# Patient Record
Sex: Male | Born: 1980
Health system: Southern US, Community
[De-identification: ages and names within clinical notes are randomized; demographics above are authoritative.]

## PROBLEM LIST (undated history)

## (undated) DIAGNOSIS — R531 Weakness: Secondary | ICD-10-CM

## (undated) DIAGNOSIS — Z9109 Other allergy status, other than to drugs and biological substances: Secondary | ICD-10-CM

## (undated) DIAGNOSIS — J45990 Exercise induced bronchospasm: Secondary | ICD-10-CM

## (undated) HISTORY — DX: Other allergy status, other than to drugs and biological substances: Z91.09

## (undated) HISTORY — DX: Weakness: R53.1

## (undated) HISTORY — DX: Exercise induced bronchospasm: J45.990

---

## 2001-09-07 DIAGNOSIS — R531 Weakness: Secondary | ICD-10-CM

## 2001-09-07 HISTORY — DX: Weakness: R53.1

## 2013-05-08 DIAGNOSIS — J45990 Exercise induced bronchospasm: Secondary | ICD-10-CM

## 2013-05-08 HISTORY — DX: Exercise induced bronchospasm: J45.990

## 2013-05-09 ENCOUNTER — Encounter: Payer: Self-pay | Admitting: Medical

## 2013-05-11 ENCOUNTER — Encounter: Payer: Self-pay | Admitting: Medical

## 2013-05-11 ENCOUNTER — Ambulatory Visit (INDEPENDENT_AMBULATORY_CARE_PROVIDER_SITE_OTHER): Payer: Managed Care, Other (non HMO) | Admitting: Medical

## 2013-05-11 VITALS — BP 110/70 | HR 84 | Temp 98.1°F | Resp 16 | Ht 69.2 in | Wt 254.0 lb

## 2013-05-11 DIAGNOSIS — A630 Anogenital (venereal) warts: Secondary | ICD-10-CM

## 2013-05-11 DIAGNOSIS — Z113 Encounter for screening for infections with a predominantly sexual mode of transmission: Secondary | ICD-10-CM

## 2013-05-11 DIAGNOSIS — F17200 Nicotine dependence, unspecified, uncomplicated: Secondary | ICD-10-CM

## 2013-05-11 DIAGNOSIS — Z0000 Encounter for general adult medical examination without abnormal findings: Secondary | ICD-10-CM

## 2013-05-11 DIAGNOSIS — Z23 Encounter for immunization: Secondary | ICD-10-CM

## 2013-05-11 DIAGNOSIS — L988 Other specified disorders of the skin and subcutaneous tissue: Secondary | ICD-10-CM

## 2013-05-11 DIAGNOSIS — B354 Tinea corporis: Secondary | ICD-10-CM

## 2013-05-11 LAB — COMPREHENSIVE METABOLIC PANEL
ALT: 48 U/L (ref 0–53)
CO2: 26 mEq/L (ref 19–32)
Sodium: 140 mEq/L (ref 135–145)
Total Bilirubin: 0.5 mg/dL (ref 0.3–1.2)
Total Protein: 7 g/dL (ref 6.0–8.3)

## 2013-05-11 LAB — POCT URINALYSIS DIPSTICK
Bilirubin, UA: NEGATIVE
Clarity, UA: NEGATIVE
Glucose, UA: NEGATIVE
Ketones, UA: NEGATIVE
Leukocytes, UA: NEGATIVE
Protein, UA: NEGATIVE

## 2013-05-11 MED ORDER — TERBINAFINE HCL 1 % EX CREA: TOPICAL_CREAM | Freq: Two times a day (BID) | CUTANEOUS | Status: DC

## 2013-05-11 NOTE — Patient Instructions (Addendum)
Dentist recommendations: Dr. Yancey Flemings, dentist [address], [place], [place] [address] [phone] [email or web address]     YOU CAN QUIT SMOKING!  Talk to your medical provider about using medicines to help you quit. These include nicotine replacement gum, lozenges, or skin patches.  Consider calling 1-800-QUIT-NOW, a toll free 24/7 hotline with free counseling to help you quit.  If you are ready to quit smoking or are thinking about it, congratulations! You have chosen to help yourself be healthier and live longer! There are lots of different ways to quit smoking. Nicotine gum, nicotine patches, a nicotine inhaler, or nicotine nasal spray can help with physical craving. Hypnosis, support groups, and medicines help break the habit of smoking. TIPS TO GET OFF AND STAY OFF CIGARETTES  Learn to predict your moods. Do not let a bad situation be your excuse to have a cigarette. Some situations in your life might tempt you to have a cigarette.   Ask friends and co-workers not to smoke around you.   Make your home smoke-free.   Never have "just one" cigarette. It leads to wanting another and another. Remind yourself of your decision to quit.   On a card, make a list of your reasons for not smoking. Read it at least the same number of times a day as you have a cigarette. Tell yourself everyday, "I do not want to smoke. I choose not to smoke."   Ask someone at home or work to help you with your plan to quit smoking.   Have something planned after you eat or have a cup of coffee. Take a walk or get other exercise to perk you up. This will help to keep you from overeating.   Try a relaxation exercise to calm you down and decrease your stress. Remember, you may be tense and nervous the first two weeks after you quit. This will pass.   Find new activities to keep your hands busy. Play with a pen, coin, or rubber band. Doodle or draw things on paper.   Brush your teeth right after eating. This  will help cut down the craving for the taste of tobacco after meals. You can try mouthwash too.   Try gum, breath mints, or diet candy to keep something in your mouth.  IF YOU SMOKE AND WANT TO QUIT:  Do not stock up on cigarettes. Never buy a carton. Wait until one pack is finished before you buy another.   Never carry cigarettes with you at work or at home.   Keep cigarettes as far away from you as possible. Leave them with someone else.   Never carry matches or a lighter with you.   Ask yourself, "Do I need this cigarette or is this just a reflex?"   Bet with someone that you can quit. Put cigarette money in a piggy bank every morning. If you smoke, you give up the money. If you do not smoke, by the end of the week, you keep the money.   Keep trying. It takes 21 days to change a habit!  Document Released: 06/20/2009 Document Revised: 05/06/2011 Document Reviewed: 06/20/2009 [facility] Patient Information 2012 [place], [place].   Body Ringworm Ringworm (tinea corporis) is a fungal infection of the skin on the body. This infection is not caused by worms, but is actually caused by a fungus. Fungus normally lives on the top of your skin and can be useful. However, in the case of ringworms, the fungus grows out of control and causes  a skin infection. It can involve any area of skin on the body and can spread easily from one person to another (contagious). Ringworm is a common problem for children, but it can affect adults as well. Ringworm is also often found in athletes, especially wrestlers who share equipment and mats.  CAUSES  Ringworm of the body is caused by a fungus called dermatophyte. It can spread by:  Touchingother people who are infected.  Touchinginfected pets.  Touching or sharingobjects that have been in contact with the infected person or pet (hats, combs, towels, clothing, sports equipment). SYMPTOMS   Itchy, raised red spots and bumps on the skin.  Ring-shaped  rash.  Redness near the border of the rash with a clear center.  Dry and scaly skin on or around the rash. Not every person develops a ring-shaped rash. Some develop only the red, scaly patches. DIAGNOSIS  Most often, ringworm can be diagnosed by performing a skin exam. Your caregiver may choose to take a skin scraping from the affected area. The sample will be examined under the microscope to see if the fungus is present.  TREATMENT  Body ringworm may be treated with a topical antifungal cream or ointment. Sometimes, an antifungal shampoo that can be used on your body is prescribed. You may be prescribed antifungal medicines to take by mouth if your ringworm is severe, keeps coming back, or lasts a long time.  HOME CARE INSTRUCTIONS   Only take over-the-counter or prescription medicines as directed by your caregiver.  Wash the infected area and dry it completely before applying yourcream or ointment.  When using antifungal shampoo to treat the ringworm, leave the shampoo on the body for 3 5 minutes before rinsing.   Wear loose clothing to stop clothes from rubbing and irritating the rash.  Wash or change your bed sheets every night while you have the rash.  Have your pet treated by your veterinarian if it has the same infection. To prevent ringworm:   Practice good hygiene.  Wear sandals or shoes in public places and showers.  Do not share personal items with others.  Avoid touching red patches of skin on other people.  Avoid touching pets that have bald spots or wash your hands after doing so. SEEK MEDICAL CARE IF:   Your rash continues to spread after 7 days of treatment.  Your rash is not gone in 4 weeks.  The area around your rash becomes red, warm, tender, and swollen. Document Released: 08/21/2000 Document Revised: 05/18/2012 Document Reviewed: 03/07/2012 [facility] Patient Information 2014 [place], [place].   Preventative Care for Adults, Male       REGULAR  HEALTH EXAMS:  A routine yearly physical is a good way to check in with your primary care provider about your health and preventive screening. It is also an opportunity to share updates about your health and any concerns you have, and receive a thorough all-over exam.   Most health insurance companies pay for at least some preventative services.  Check with your health plan for specific coverages.  WHAT PREVENTATIVE SERVICES DO MEN NEED?  Adult men should have their weight and blood pressure checked regularly.   Men age 84 and older should have their cholesterol levels checked regularly.  Beginning at age 39 and continuing to age 70, men should be screened for colorectal cancer.  Certain people should may need continued testing until age 43.  Other cancer screening may include exams for testicular and prostate cancer.  Updating vaccinations  is part of preventative care.  Vaccinations help protect against diseases such as the flu.  Lab tests are generally done as part of preventative care to screen for anemia and blood disorders, to screen for problems with the kidneys and liver, to screen for bladder problems, to check blood sugar, and to check your cholesterol level.  Preventative services generally include counseling about diet, exercise, avoiding tobacco, drugs, excessive alcohol consumption, and sexually transmitted infections.    GENERAL RECOMMENDATIONS FOR GOOD HEALTH:  Healthy diet:  Eat a variety of foods, including fruit, vegetables, animal or vegetable protein, such as meat, fish, chicken, and eggs, or beans, lentils, tofu, and grains, such as rice.  Drink plenty of water daily.  Decrease saturated fat in the diet, avoid lots of red meat, processed foods, sweets, fast foods, and fried foods.  Exercise:  Aerobic exercise helps maintain good heart health. At least 30-40 minutes of moderate-intensity exercise is recommended. For example, a brisk walk that increases your heart  rate and breathing. This should be done on most days of the week.   Find a type of exercise or a variety of exercises that you enjoy so that it becomes a part of your daily life.  Examples are running, walking, swimming, water aerobics, and biking.  For motivation and support, explore group exercise such as aerobic class, spin class, Zumba, Yoga,or  martial arts, etc.    Set exercise goals for yourself, such as a certain weight goal, walk or run in a race such as a 5k walk/run.  Speak to your primary care provider about exercise goals.  Disease prevention:  If you smoke or chew tobacco, find out from your caregiver how to quit. It can literally save your life, no matter how long you have been a tobacco user. If you do not use tobacco, never begin.   Maintain a healthy diet and normal weight. Increased weight leads to problems with blood pressure and diabetes.   The Body Mass Index or BMI is a way of measuring how much of your body is fat. Having a BMI above 27 increases the risk of heart disease, diabetes, hypertension, stroke and other problems related to obesity. Your caregiver can help determine your BMI and based on it develop an exercise and dietary program to help you achieve or maintain this important measurement at a healthful level.  High blood pressure causes heart and blood vessel problems.  Persistent high blood pressure should be treated with medicine if weight loss and exercise do not work.   Fat and cholesterol leaves deposits in your arteries that can block them. This causes heart disease and vessel disease elsewhere in your body.  If your cholesterol is found to be high, or if you have heart disease or certain other medical conditions, then you may need to have your cholesterol monitored frequently and be treated with medication.   Ask if you should have a stress test if your history suggests this. A stress test is a test done on a treadmill that looks for heart disease. This test  can find disease prior to there being a problem.  Avoid drinking alcohol in excess (more than two drinks per day).  Avoid use of street drugs. Do not share needles with anyone. Ask for professional help if you need assistance or instructions on stopping the use of alcohol, cigarettes, and/or drugs.  Brush your teeth twice a day with fluoride toothpaste, and floss once a day. Good oral hygiene prevents tooth decay and  gum disease. The problems can be painful, unattractive, and can cause other health problems. Visit your dentist for a routine oral and dental check up and preventive care every 6-12 months.   Look at your skin regularly.  Use a mirror to look at your back. Notify your caregivers of changes in moles, especially if there are changes in shapes, colors, a size larger than a pencil eraser, an irregular border, or development of new moles.  Safety:  Use seatbelts 100% of the time, whether driving or as a passenger.  Use safety devices such as hearing protection if you work in environments with loud noise or significant background noise.  Use safety glasses when doing any work that could send debris in to the eyes.  Use a helmet if you ride a bike or motorcycle.  Use appropriate safety gear for contact sports.  Talk to your caregiver about gun safety.  Use sunscreen with a SPF (or skin protection factor) of 15 or greater.  Lighter skinned people are at a greater risk of skin cancer. Don't forget to also wear sunglasses in order to protect your eyes from too much damaging sunlight. Damaging sunlight can accelerate cataract formation.   Practice safe sex. Use condoms. Condoms are used for birth control and to help reduce the spread of sexually transmitted infections (or STIs).  Some of the STIs are gonorrhea (the clap), chlamydia, syphilis, trichomonas, herpes, HPV (human papilloma virus) and HIV (human immunodeficiency virus) which causes AIDS. The herpes, HIV and HPV are viral illnesses that have  no cure. These can result in disability, cancer and death.   Keep carbon monoxide and smoke detectors in your home functioning at all times. Change the batteries every 6 months or use a model that plugs into the wall.   Vaccinations:  Stay up to date with your tetanus shots and other required immunizations. You should have a booster for tetanus every 10 years. Be sure to get your flu shot every year, since 5%-20% of the [place] population comes down with the flu. The flu vaccine changes each year, so being vaccinated once is not enough. Get your shot in the fall, before the flu season peaks.   Other vaccines to consider:  Pneumococcal vaccine to protect against certain types of pneumonia.  This is normally recommended for adults age 53 or older.  However, adults younger than 32 years old with certain underlying conditions such as diabetes, heart or lung disease should also receive the vaccine.  Shingles vaccine to protect against Varicella Zoster if you are older than age 2, or younger than 32 years old with certain underlying illness.  Hepatitis A vaccine to protect against a form of infection of the liver by a virus acquired from food.  Hepatitis B vaccine to protect against a form of infection of the liver by a virus acquired from blood or body fluids, particularly if you work in health care.  If you plan to travel internationally, check with your local health department for specific vaccination recommendations.  Cancer Screening:  Most routine colon cancer screening begins at the age of 58. On a yearly basis, doctors may provide special easy to use take-home tests to check for hidden blood in the stool. Sigmoidoscopy or colonoscopy can detect the earliest forms of colon cancer and is life saving. These tests use a small camera at the end of a tube to directly examine the colon. Speak to your caregiver about this at age 39, when routine screening begins (and  is repeated every 5 years unless  early forms of pre-cancerous polyps or small growths are found).   At the age of 74 men usually start screening for prostate cancer every year. Screening may begin at a younger age for those with higher risk. Those at higher risk include African-Americans or having a family history of prostate cancer. There are two types of tests for prostate cancer:   Prostate-specific antigen (PSA) testing. Recent studies raise questions about prostate cancer using PSA and you should discuss this with your caregiver.   Digital rectal exam (in which your doctor's lubricated and gloved finger feels for enlargement of the prostate through the anus).   Screening for testicular cancer.  Do a monthly exam of your testicles. Gently roll each testicle between your thumb and fingers, feeling for any abnormal lumps. The best time to do this is after a hot shower or bath when the tissues are looser. Notify your caregivers of any lumps, tenderness or changes in size or shape immediately.

## 2013-05-11 NOTE — Progress Notes (Signed)
Subjective:   HPI  Joshua Cox is a 32 y.o. male who presents for a complete physical.  New patient today.  Moved recently from [facility], [place].  Moved here for employment.     Preventative care: Last ophthalmology visit:N/A Last dental visit:N/A Last colonoscopy:N/A Last prostate exam: 2000 Last EKG:N/A Last labs:N/A  Prior vaccinations: TD or Tdap:2004 Influenza:06/2012 Pneumococcal:N/A Shingles/Zostavax:N/A  Advanced directive:N/A Health care power of attorney:N/A Living will:N/A  Concerns: Tobacco user - has used patches, chantix in the past.   Quit for a year with chantix but side effects included GI side effects and feeling of somnolence all the time/really interfered with sleep.    Exercise induced asthma - mild.   Doesn't have to use inhaler. 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   He reports rash on stomach and leg, been there 2 wk, thinks it may be ringworm.  Has a lump in genital area that he thinks is a genital wart x 48mo.   Has gotten a little bigger.  Wants baseline STD testing.   Reviewed their medical, surgical, family, social, medication, and allergy history and updated chart as appropriate.  Past Medical History  Diagnosis Date  . Environmental allergies   . Weakness 2003    acute fatigue, weakness - overnight hospitalization, but patient doesn't recall details  . Exercise-induced asthma 9/14    mild    History reviewed. No pertinent past surgical history.  History   Social History  . Marital Status: Unknown    Spouse Name: N/A    Number of Children: N/A  . Years of Education: N/A   Occupational History  . Not on file.   Social History Main Topics  . Smoking status: Current Every Day Smoker -- 0.75 packs/day for 14 years  . Smokeless tobacco: Not on file  . Alcohol Use: Not on file     Comment: once monthly  . Drug Use: No  . Sexual Activity: Not Currently   Other Topics Concern  . 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 Other Mother     vision problems  . Other Sister     vision problems  . Heart disease Paternal Uncle   . Heart disease Paternal Grandfather   . Cancer Neg Hx   . Stroke Neg Hx   . Hypertension Neg Hx   . Hyperlipidemia Neg Hx   . Diabetes Neg Hx     No current outpatient prescriptions on file.  No Known Allergies   Review of Systems Constitutional: -fever, -chills, -sweats, -unexpected weight change, -decreased appetite, -fatigue Allergy: -sneezing, -itching, -congestion Dermatology: -changing moles, +rash, -lumps ENT: -runny nose, -ear pain, -sore throat, -hoarseness, -sinus pain, -teeth pain, - ringing in ears, -hearing loss, -nosebleeds Cardiology: -chest pain, -palpitations, -swelling, -difficulty breathing when lying flat, -waking up short of breath Respiratory: -cough, -shortness of breath, -difficulty breathing with exercise or exertion, -wheezing, -coughing up blood Gastroenterology: -abdominal pain, -nausea, -vomiting, -diarrhea, -constipation, -blood in stool, -changes in bowel movement, -difficulty swallowing or eating Hematology: -bleeding, -bruising  Musculoskeletal: -joint aches, -muscle aches, -joint swelling, -back pain, -neck pain, -cramping, -changes in gait Ophthalmology: denies vision changes, eye redness, itching, discharge Urology: -burning with urination, -difficulty urinating, -blood in urine, -urinary frequency, -urgency, -  incontinence Neurology: -headache, -weakness, -tingling, -numbness, -memory loss, -falls, -dizziness Psychology: -depressed mood, -agitation, +sleep problems     Objective:   Physical Exam  BP 110/70  Pulse 84  Temp(Src) 98.1 F (36.7 C) (Oral)  Resp 16  Ht 5' 9.2" (1.758 m)  Wt 254 lb (115.214 kg)  BMI  37.28 kg/m2  General appearance: alert, no distress, WD/WN, white male Skin: several small round lesions with raised erythematous border on left anterior upper thigh and left lower flank.  verrucal 3mm diameter lesion raised of left penis shaft suggestive of wart, posterior scalp with 2 brown flat somewhat rhomboid shaped macules, 2mm x 3mm size, few other scattered benign macules.  Tattoos bilat arms. HEENT: normocephalic, conjunctiva/corneas normal, sclerae anicteric, PERRLA, EOMi, nares patent, no discharge or erythema, pharynx normal Oral cavity: MMM, tongue normal, teeth in good repair Neck: supple, no lymphadenopathy, no thyromegaly, no masses, normal ROM Chest: non tender, normal shape and expansion Heart: RRR, normal S1, S2, no murmurs Lungs: CTA bilaterally, no wheezes, rhonchi, or rales Abdomen: +bs, soft, non tender, non distended, no masses, no hepatomegaly, no splenomegaly, no bruits Back: non tender, normal ROM, no scoliosis Musculoskeletal: upper extremities non tender, no obvious deformity, normal ROM throughout, lower extremities non tender, no obvious deformity, normal ROM throughout Extremities: no edema, no cyanosis, no clubbing Pulses: 2+ symmetric, upper and lower extremities, normal cap refill Neurological: alert, oriented x 3, CN2-12 intact, strength normal upper extremities and lower extremities, sensation normal throughout, DTRs 2+ throughout, no cerebellar signs, gait normal Psychiatric: normal affect, behavior normal, pleasant  GU: normal male external genitalia, circumcised, nontender, no masses, no hernia, no lymphadenopathy Rectal: deferred   Assessment and Plan :      Encounter Diagnoses  Name Primary?  . Routine general medical examination at a health care facility Yes  . Need for Tdap vaccination   . Screening for STD (sexually transmitted disease)   . Tobacco use disorder   . Tinea corporis   . Genital warts   . Skin macule     Physical exam -  discussed healthy lifestyle, diet, exercise, preventative care, vaccinations, and addressed their concerns.   Counseled on the Tdap (tetanus, diptheria, and acellular pertussis) vaccine.  Vaccine information sheet given. Tdap vaccine given after consent obtained. STD screening today, discussed safe sex, prevention. Tobacco - recommended cessation, gave handout and cessation.  Encouraged him to try again to quit.  He has quit in the past. Tinea - begin OTC Lamisil topically. Genital wart - left penis.   Referral to dermatology for treatment Skin macule - posterior scalp.  He will have dermatology to evaluate this as well although it doesn't look particularly worrisome.  Follow-up pending labs, referral

## 2013-05-12 LAB — CBC WITH DIFFERENTIAL/PLATELET
Basophils Absolute: 0.1 10*3/uL (ref 0.0–0.1)
Basophils Relative: 1 % (ref 0–1)
Eosinophils Absolute: 0.3 10*3/uL (ref 0.0–0.7)
Eosinophils Relative: 5 % (ref 0–5)
Lymphocytes Relative: 32 % (ref 12–46)
MCH: 32.3 pg (ref 26.0–34.0)
MCV: 92 fL (ref 78.0–100.0)
Platelets: 248 10*3/uL (ref 150–400)
RDW: 13.5 % (ref 11.5–15.5)
WBC: 7.6 10*3/uL (ref 4.0–10.5)

## 2013-11-27 ENCOUNTER — Encounter (HOSPITAL_COMMUNITY): Payer: Self-pay | Admitting: Emergency Medicine

## 2013-11-27 ENCOUNTER — Emergency Department (INDEPENDENT_AMBULATORY_CARE_PROVIDER_SITE_OTHER): Payer: 59

## 2013-11-27 ENCOUNTER — Emergency Department (HOSPITAL_COMMUNITY)
Admission: EM | Admit: 2013-11-27 | Discharge: 2013-11-27 | Disposition: A | Discharge status: Home / Self Care | Payer: 59 | Source: Home / Self Care | Attending: Family Medicine | Admitting: Family Medicine

## 2013-11-27 DIAGNOSIS — M549 Dorsalgia, unspecified: Secondary | ICD-10-CM

## 2013-11-27 LAB — COMPREHENSIVE METABOLIC PANEL
ALT: 49 U/L (ref 0–53)
AST: 27 U/L (ref 0–37)
Albumin: 4.2 g/dL (ref 3.5–5.2)
Alkaline Phosphatase: 115 U/L (ref 39–117)
BILIRUBIN TOTAL: 0.3 mg/dL (ref 0.3–1.2)
BUN: 16 mg/dL (ref 6–23)
CHLORIDE: 106 meq/L (ref 96–112)
CO2: 26 meq/L (ref 19–32)
CREATININE: 0.96 mg/dL (ref 0.50–1.35)
Calcium: 10 mg/dL (ref 8.4–10.5)
GFR calc Af Amer: >90 mL/min (ref >90)
Glucose, Bld: 93 mg/dL (ref 70–99)
POTASSIUM: 4.8 meq/L (ref 3.7–5.3)
SODIUM: 143 meq/L (ref 137–147)
Total Protein: 7.7 g/dL (ref 6.0–8.3)

## 2013-11-27 LAB — CBC WITH DIFFERENTIAL/PLATELET
BASOS PCT: 1 % (ref 0–1)
Basophils Absolute: 0.1 10*3/uL (ref 0.0–0.1)
Eosinophils Absolute: 0.5 10*3/uL (ref 0.0–0.7)
Eosinophils Relative: 6 % — ABNORMAL HIGH (ref 0–5)
HEMATOCRIT: 46.3 % (ref 39.0–52.0)
Hemoglobin: 16.7 g/dL (ref 13.0–17.0)
LYMPHS PCT: 39 % (ref 12–46)
Lymphs Abs: 2.9 10*3/uL (ref 0.7–4.0)
MCH: 33.2 pg (ref 26.0–34.0)
MCHC: 36.1 g/dL — AB (ref 30.0–36.0)
MCV: 92 fL (ref 78.0–100.0)
Monocytes Absolute: 1 10*3/uL (ref 0.1–1.0)
Monocytes Relative: 13 % — ABNORMAL HIGH (ref 3–12)
NEUTROS PCT: 41 % — AB (ref 43–77)
Neutro Abs: 3.1 10*3/uL (ref 1.7–7.7)
PLATELETS: 216 10*3/uL (ref 150–400)
RBC: 5.03 MIL/uL (ref 4.22–5.81)
RDW: 12.5 % (ref 11.5–15.5)
WBC: 7.5 10*3/uL (ref 4.0–10.5)

## 2013-11-27 MED ORDER — TRAMADOL HCL 50 MG PO TABS: 50.0000 mg | ORAL_TABLET | Freq: Four times a day (QID) | ORAL | Status: DC | PRN

## 2013-11-27 MED ORDER — ETODOLAC 500 MG PO TABS: 500.0000 mg | ORAL_TABLET | Freq: Two times a day (BID) | ORAL | Status: DC

## 2013-11-27 NOTE — ED Notes (Signed)
Pt c/o back pain onset 10 days Pain is constant and increases w/activity Denies inj/trauma, urinary sxs, abd pain Alert w/no signs of acute distress.

## 2013-11-27 NOTE — ED Provider Notes (Signed)
CSN: [id number]     Arrival date & time 11/27/13  1004 History   None    Chief Complaint  Patient presents with  . Back Pain   (Consider location/radiation/quality/duration/timing/severity/associated sxs/prior Treatment) HPI Comments: [age] presents for eval of 10 days of worsening left upper back pain.  The pain is constant and has been worsening.  It is occasionally made worse with activity and with taking a deep breath, but right now there is no pleuritic pain.  No alleviating factors.  Denies any other associated symptoms including no fever, chills, NVD, CP, SOB.  No affected by eating.    Patient is a 33 y.o. male presenting with back pain.  Back Pain Associated symptoms: no abdominal pain, no chest pain, no dysuria, no fever and no weakness     Past Medical History  Diagnosis Date  . Environmental allergies   . Weakness 2003    acute fatigue, weakness - overnight hospitalization, but patient doesn't recall details  . Exercise-induced asthma 9/14    mild   History reviewed. No pertinent past surgical history. Family History  Problem Relation Age of Onset  . Other Mother     vision problems  . Other Sister     vision problems  . Heart disease Paternal Uncle   . Heart disease Paternal Grandfather   . Cancer Neg Hx   . Stroke Neg Hx   . Hypertension Neg Hx   . Hyperlipidemia Neg Hx   . Diabetes Neg Hx    History  Substance Use Topics  . Smoking status: Current Every Day Smoker -- 0.75 packs/day for 14 years  . Smokeless tobacco: Not on file  . Alcohol Use: Not on file     Comment: once monthly    Review of Systems  Constitutional: Negative for fever, chills and fatigue.  HENT: Negative for sore throat.   Eyes: Negative for visual disturbance.  Respiratory: Negative for cough and shortness of breath.   Cardiovascular: Negative for chest pain, palpitations and leg swelling.  Gastrointestinal: Negative for nausea, vomiting, abdominal pain, diarrhea and constipation.   Genitourinary: Negative for dysuria, urgency, frequency and hematuria.  Musculoskeletal: Positive for back pain. Negative for arthralgias, joint swelling, myalgias, neck pain and neck stiffness.  Skin: Negative for rash.  Neurological: Negative for dizziness, weakness and light-headedness.    Allergies  Review of patient's allergies indicates no known allergies.  Home Medications   Current Outpatient Rx  Name  Route  Sig  Dispense  Refill  . etodolac (LODINE) 500 MG tablet   Oral   Take 1 tablet (500 mg total) by mouth 2 (two) times daily.   30 tablet   1   . terbinafine (LAMISIL) 1 % cream   Topical   Apply topically 2 (two) times daily.   30 g   0   . traMADol (ULTRAM) 50 MG tablet   Oral   Take 1 tablet (50 mg total) by mouth every 6 (six) hours as needed.   20 tablet   0     Dispense as written.    BP 114/70  Pulse 75  Temp(Src) 98.1 F (36.7 C) (Oral)  Resp 16  SpO2 99% Physical Exam  Nursing note and vitals reviewed. Constitutional: He is oriented to person, place, and time. He appears well-developed and well-nourished. No distress.  HENT:  Head: Normocephalic.  Neck: Normal range of motion. Neck supple. No JVD present.  Cardiovascular: Normal rate, regular rhythm and normal heart sounds.  Exam  reveals no gallop and no friction rub.   No murmur heard. Pulmonary/Chest: Effort normal and breath sounds normal. No respiratory distress. He has no wheezes. He has no rales.  Abdominal: Soft. Bowel sounds are normal. He exhibits no mass. There is no hepatosplenomegaly. There is no tenderness. There is no rigidity, no rebound, no guarding, no CVA tenderness, no tenderness at McBurney's point and negative Murphy's sign.  Musculoskeletal:       Cervical back: Normal.       Thoracic back: Normal.  Neurological: He is alert and oriented to person, place, and time. Coordination normal.  Skin: Skin is warm and dry. No rash noted. He is not diaphoretic.  Psychiatric: He  has a normal mood and affect. Judgment normal.    ED Course  Procedures (including critical care time) Labs Review Labs Reviewed  CBC WITH DIFFERENTIAL - Abnormal; Notable for the following:    MCHC 36.1 (*)    Neutrophils Relative % 41 (*)    Monocytes Relative 13 (*)    Eosinophils Relative 6 (*)    All other components within normal limits  COMPREHENSIVE METABOLIC PANEL   Imaging Review Dg Abd Acute W/chest  11/27/2013   CLINICAL DATA:  Upper back pain.  EXAM: ACUTE ABDOMEN SERIES (ABDOMEN 2 VIEW & CHEST 1 VIEW)  COMPARISON:  None.  FINDINGS: Large stool burden throughout the colon. No evidence of bowel obstruction or free air. No organomegaly or suspicious calcification.  Heart and mediastinal contours are within normal limits. No focal opacities or effusions. No acute bony abnormality.  IMPRESSION: Large stool burden throughout the colon.  No acute cardiopulmonary disease.   Electronically Signed   By: Charlett Nose M.D.   On: 11/27/2013 12:02     MDM   1. Back pain    CBC and XR normal.  CMP pending.  Will DC with ultram and lodine.  F/u if not improving, if CMP abnormal will get abdominal US    CMP came back normal   Meds ordered this encounter  Medications  . etodolac (LODINE) 500 MG tablet    Sig: Take 1 tablet (500 mg total) by mouth 2 (two) times daily.    Dispense:  30 tablet    Refill:  1    Order Specific Question:  Supervising Provider    Answer:  Clementeen Graham, S [id number]  . traMADol (ULTRAM) 50 MG tablet    Sig: Take 1 tablet (50 mg total) by mouth every 6 (six) hours as needed.    Dispense:  20 tablet    Refill:  0    Order Specific Question:  Supervising Provider    Answer:  Clementeen Graham, Kathie Rhodes [[id number]]       Graylon Good, PA-C 11/27/13 [phone]

## 2013-11-27 NOTE — ED Notes (Signed)
Pt has been triaged and assessed by physician. Physician in room before CMA.

## 2013-11-27 NOTE — Discharge Instructions (Signed)
Back Pain, Adult Low back pain is very common. About 1 in 5 people have back pain.The cause of low back pain is rarely dangerous. The pain often gets better over time.About half of people with a sudden onset of back pain feel better in just 2 weeks. About 8 in 10 people feel better by 6 weeks.  CAUSES Some common causes of back pain include:  Strain of the muscles or ligaments supporting the spine.  Wear and tear (degeneration) of the spinal discs.  Arthritis.  Direct injury to the back. DIAGNOSIS Most of the time, the direct cause of low back pain is not known.However, back pain can be treated effectively even when the exact cause of the pain is unknown.Answering your caregiver's questions about your overall health and symptoms is one of the most accurate ways to make sure the cause of your pain is not dangerous. If your caregiver needs more information, he or she may order lab work or imaging tests (X-rays or MRIs).However, even if imaging tests show changes in your back, this usually does not require surgery. HOME CARE INSTRUCTIONS For many people, back pain returns.Since low back pain is rarely dangerous, it is often a condition that people can learn to manageon their own.   Remain active. It is stressful on the back to sit or stand in one place. Do not sit, drive, or stand in one place for more than 30 minutes at a time. Take short walks on level surfaces as soon as pain allows.Try to increase the length of time you walk each day.  Do not stay in bed.Resting more than 1 or 2 days can delay your recovery.  Do not avoid exercise or work.Your body is made to move.It is not dangerous to be active, even though your back may hurt.Your back will likely heal faster if you return to being active before your pain is gone.  Pay attention to your body when you bend and lift. Many people have less discomfortwhen lifting if they bend their knees, keep the load close to their bodies,and  avoid twisting. Often, the most comfortable positions are those that put less stress on your recovering back.  Find a comfortable position to sleep. Use a firm mattress and lie on your side with your knees slightly bent. If you lie on your back, put a pillow under your knees.  Only take over-the-counter or prescription medicines as directed by your caregiver. Over-the-counter medicines to reduce pain and inflammation are often the most helpful.Your caregiver may prescribe muscle relaxant drugs.These medicines help dull your pain so you can more quickly return to your normal activities and healthy exercise.  Put ice on the injured area.  Put ice in a plastic bag.  Place a towel between your skin and the bag.  Leave the ice on for 15-20 minutes, 03-04 times a day for the first 2 to 3 days. After that, ice and heat may be alternated to reduce pain and spasms.  Ask your caregiver about trying back exercises and gentle massage. This may be of some benefit.  Avoid feeling anxious or stressed.Stress increases muscle tension and can worsen back pain.It is important to recognize when you are anxious or stressed and learn ways to manage it.Exercise is a great option. SEEK MEDICAL CARE IF:  You have pain that is not relieved with rest or medicine.  You have pain that does not improve in 1 week.  You have new symptoms.  You are generally not feeling well. SEEK   IMMEDIATE MEDICAL CARE IF:   You have pain that radiates from your back into your legs.  You develop new bowel or bladder control problems.  You have unusual weakness or numbness in your arms or legs.  You develop nausea or vomiting.  You develop abdominal pain.  You feel faint. Document Released: 08/24/2005 Document Revised: 02/23/2012 Document Reviewed: 01/12/2011 [employer] Patient Information 2014 [employer]. Back Exercises Back exercises help treat and prevent back injuries. The goal of back exercises is to increase  the strength of your abdominal and back muscles and the flexibility of your back. These exercises should be started when you no longer have back pain. Back exercises include:  Pelvic Tilt. Lie on your back with your knees bent. Tilt your pelvis until the lower part of your back is against the floor. Hold this position 5 to 10 sec and repeat 5 to 10 times.  Knee to Chest. Pull first 1 knee up against your chest and hold for 20 to 30 seconds, repeat this with the other knee, and then both knees. This may be done with the other leg straight or bent, whichever feels better.  Sit-Ups or Curl-Ups. Bend your knees 90 degrees. Start with tilting your pelvis, and do a partial, slow sit-up, lifting your trunk only 30 to 45 degrees off the floor. Take at least 2 to 3 seconds for each sit-up. Do not do sit-ups with your knees out straight. If partial sit-ups are difficult, simply do the above but with only tightening your abdominal muscles and holding it as directed.  Hip-Lift. Lie on your back with your knees flexed 90 degrees. Push down with your feet and shoulders as you raise your hips a couple inches off the floor; hold for 10 seconds, repeat 5 to 10 times.  Back arches. Lie on your stomach, propping yourself up on bent elbows. Slowly press on your hands, causing an arch in your low back. Repeat 3 to 5 times. Any initial stiffness and discomfort should lessen with repetition over time.  Shoulder-Lifts. Lie face down with arms beside your body. Keep hips and torso pressed to floor as you slowly lift your head and shoulders off the floor. Do not overdo your exercises, especially in the beginning. Exercises may cause you some mild back discomfort which lasts for a few minutes; however, if the pain is more severe, or lasts for more than 15 minutes, do not continue exercises until you see your caregiver. Improvement with exercise therapy for back problems is slow.  See your caregivers for assistance with  developing a proper back exercise program. Document Released: 10/01/2004 Document Revised: 11/16/2011 Document Reviewed: 06/25/2011 [employer] Patient Information 2014 [employer].  

## 2013-11-29 NOTE — ED Provider Notes (Signed)
Medical screening examination/treatment/procedure(s) were performed by a resident physician or non-physician practitioner and as the supervising physician I was immediately available for consultation/collaboration.  Clementeen GrahamEvan Debbie Bellucci, MD    Rodolph BongEvan S Leroy Pettway, MD 11/29/13 [phone]

## 2015-01-14 IMAGING — CR DG ABDOMEN ACUTE W/ 1V CHEST
4 series · 4 of 4 positions shown · non-contrast
Comparison: None.

CLINICAL DATA: Upper back pain.

EXAM:
ACUTE ABDOMEN SERIES (ABDOMEN 2 VIEW & CHEST 1 VIEW)

[view not recorded (1 of 4)]
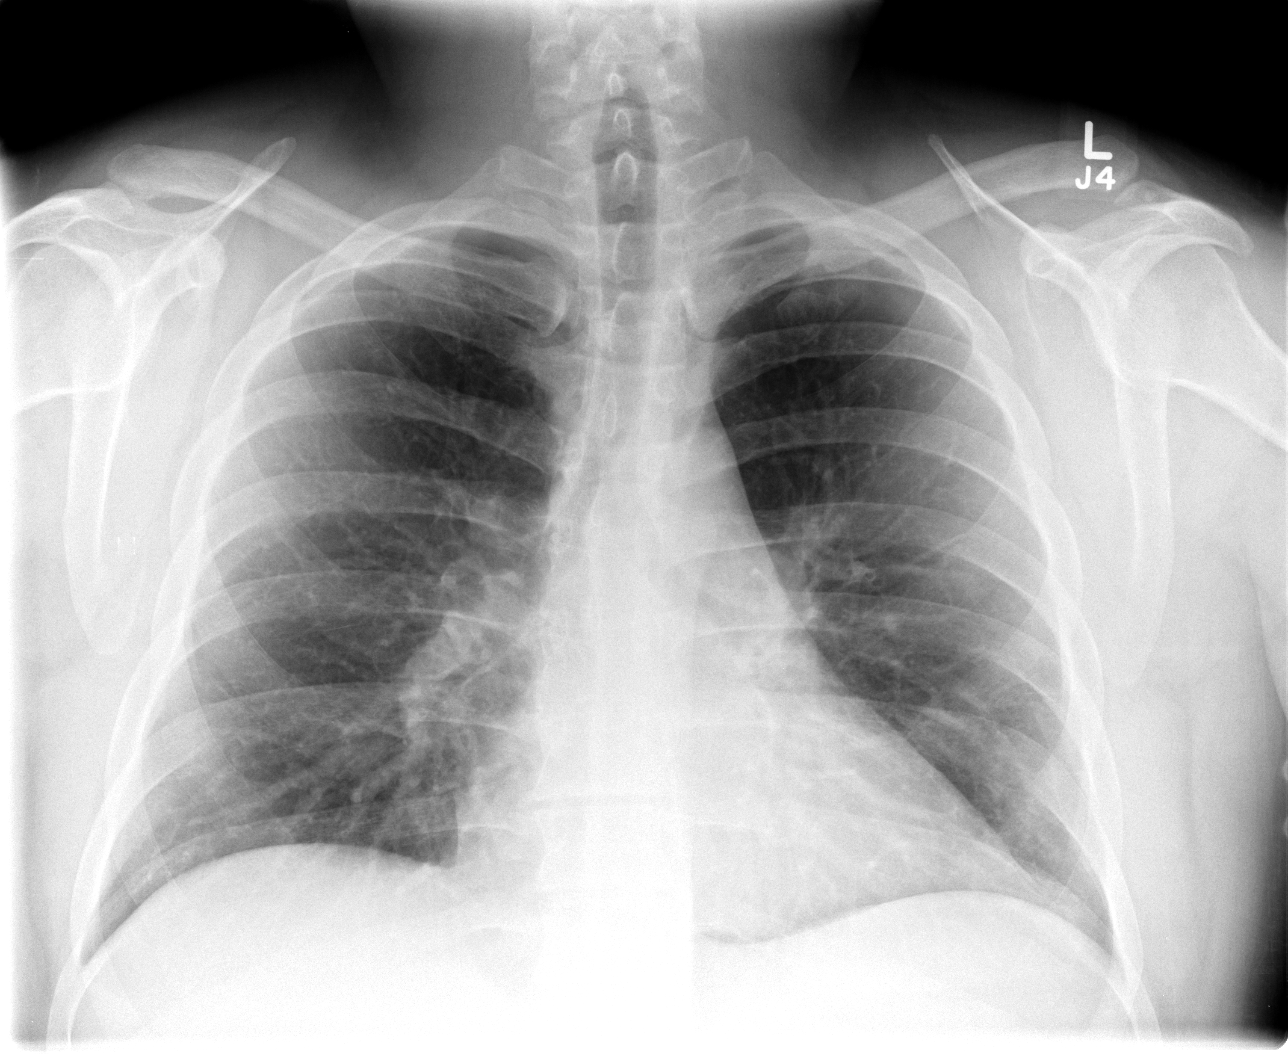

[view not recorded (2 of 4)]
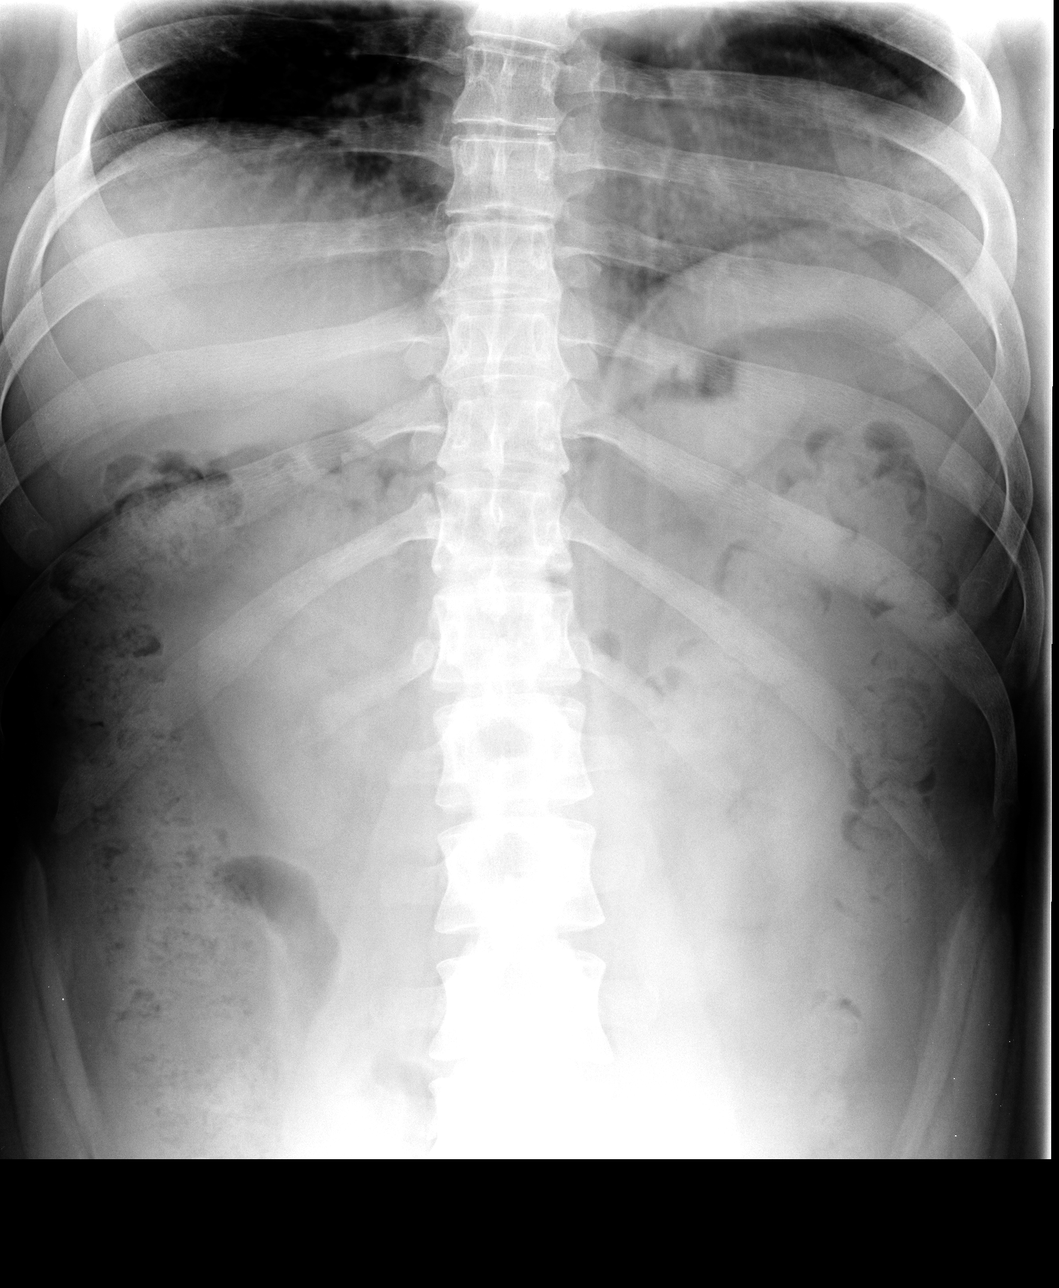

[view not recorded (3 of 4)]
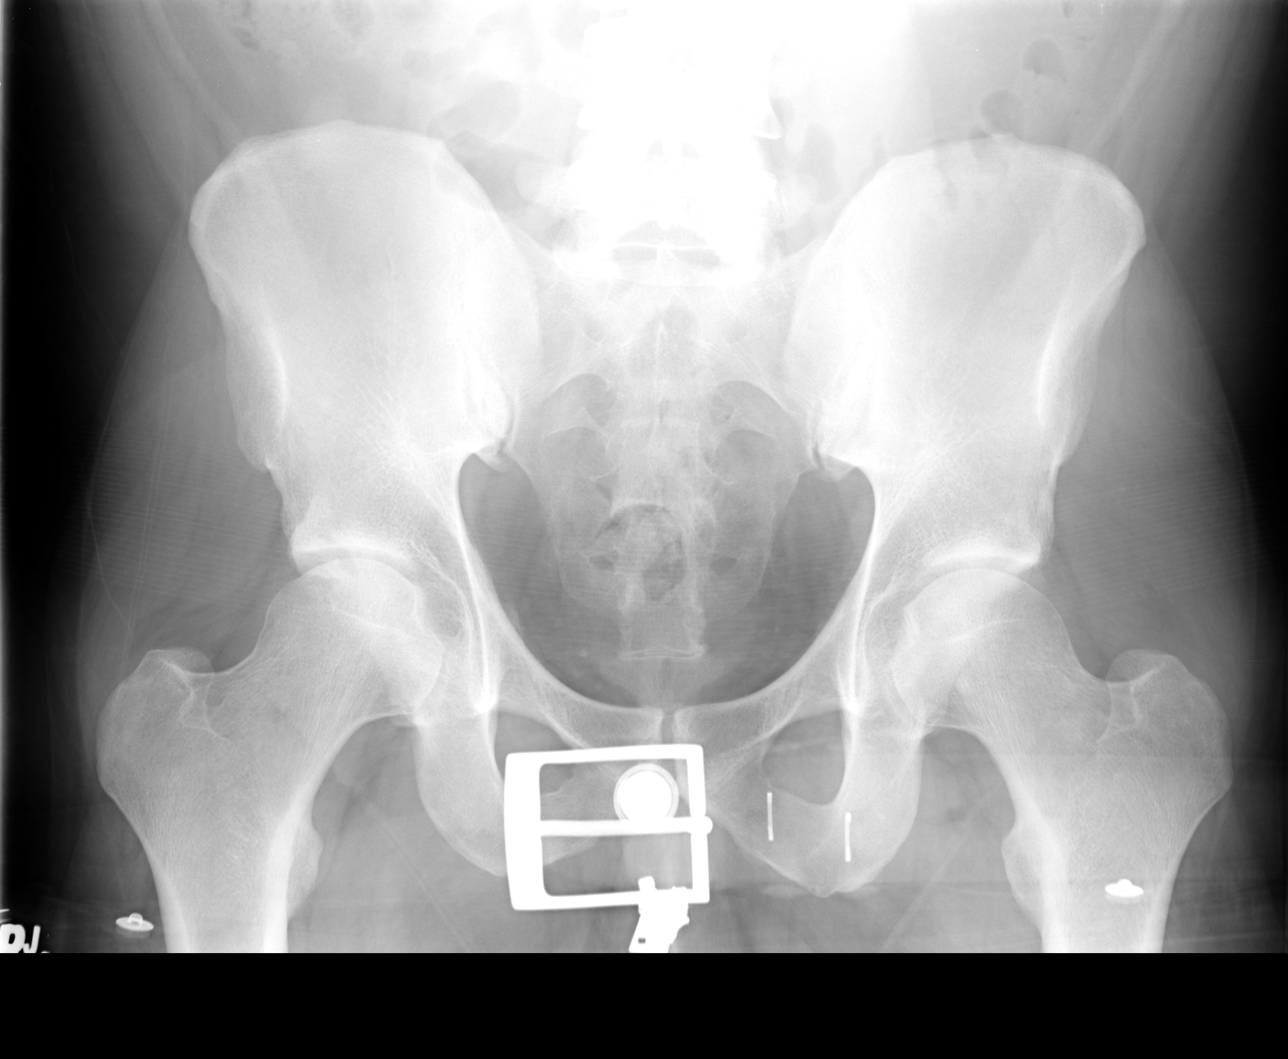

[view not recorded (4 of 4)]
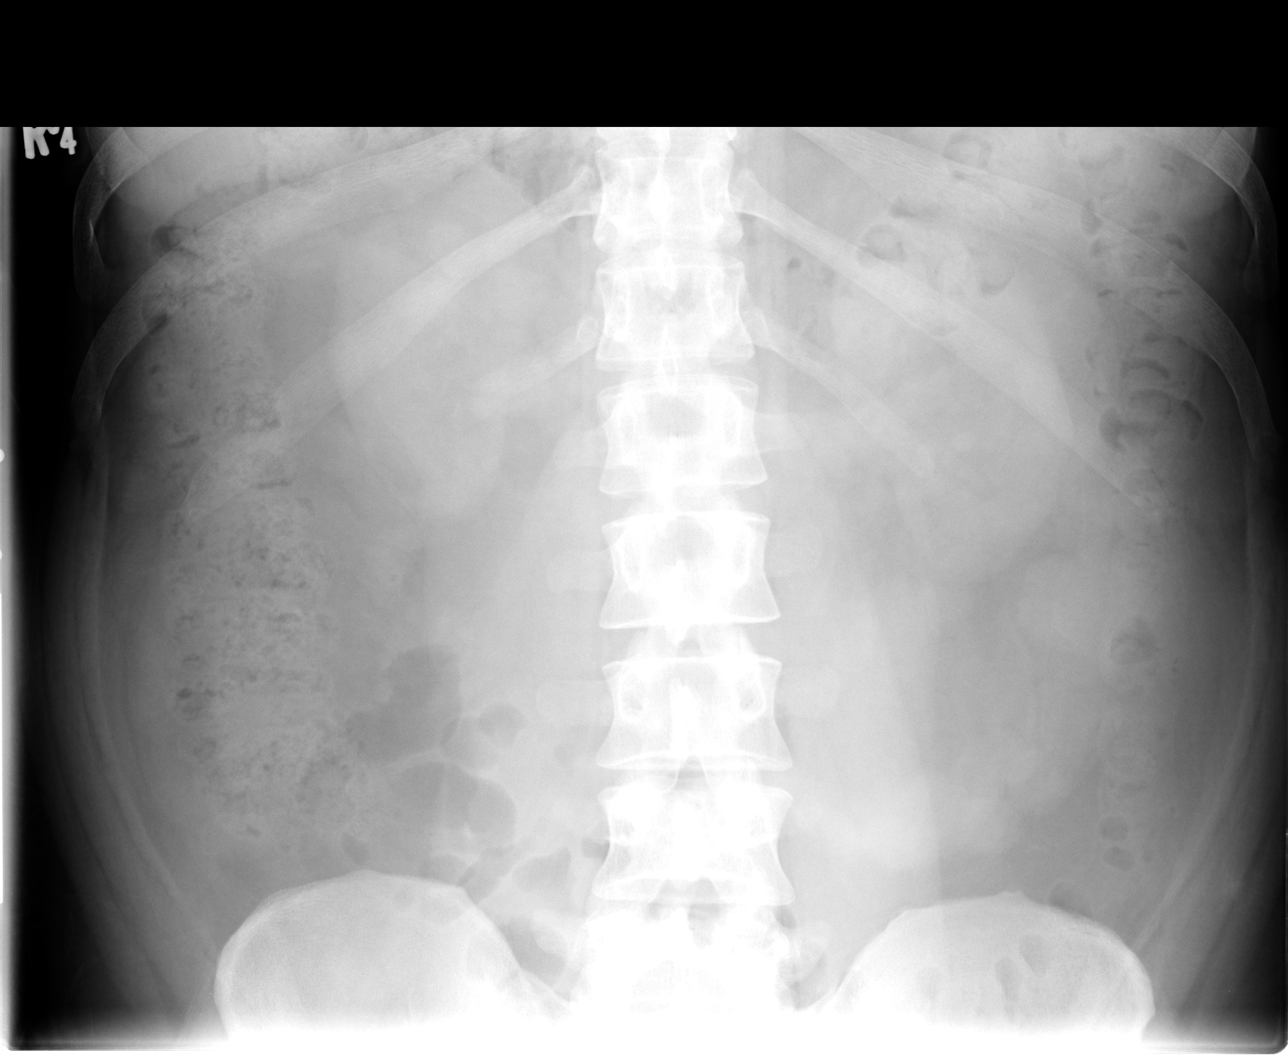

[4 of 4 positions shown; findings below may reference images not displayed]

FINDINGS: Large stool burden throughout the colon. No evidence of bowel
obstruction or free air. No organomegaly or suspicious
calcification.

Heart and mediastinal contours are within normal limits. No focal
opacities or effusions. No acute bony abnormality.
IMPRESSION: Large stool burden throughout the colon.

No acute cardiopulmonary disease.

## 2015-04-10 ENCOUNTER — Ambulatory Visit (INDEPENDENT_AMBULATORY_CARE_PROVIDER_SITE_OTHER): Payer: Commercial Managed Care - HMO | Admitting: Medical

## 2015-04-10 ENCOUNTER — Encounter: Payer: Self-pay | Admitting: Medical

## 2015-04-10 VITALS — BP 100/80 | HR 83 | Resp 15 | Wt 254.0 lb

## 2015-04-10 DIAGNOSIS — M2560 Stiffness of unspecified joint, not elsewhere classified: Secondary | ICD-10-CM | POA: Diagnosis not present

## 2015-04-10 DIAGNOSIS — F17200 Nicotine dependence, unspecified, uncomplicated: Secondary | ICD-10-CM

## 2015-04-10 DIAGNOSIS — Z720 Tobacco use: Secondary | ICD-10-CM | POA: Diagnosis not present

## 2015-04-10 DIAGNOSIS — R202 Paresthesia of skin: Secondary | ICD-10-CM | POA: Diagnosis not present

## 2015-04-10 DIAGNOSIS — R21 Rash and other nonspecific skin eruption: Secondary | ICD-10-CM

## 2015-04-10 DIAGNOSIS — M2550 Pain in unspecified joint: Secondary | ICD-10-CM

## 2015-04-10 DIAGNOSIS — M542 Cervicalgia: Secondary | ICD-10-CM

## 2015-04-10 DIAGNOSIS — M79645 Pain in left finger(s): Secondary | ICD-10-CM | POA: Diagnosis not present

## 2015-04-10 LAB — COMPREHENSIVE METABOLIC PANEL
ALBUMIN: 4.6 g/dL (ref 3.6–5.1)
ALK PHOS: 102 U/L (ref 40–115)
ALT: 49 U/L — ABNORMAL HIGH (ref 9–46)
AST: 31 U/L (ref 10–40)
BUN: 17 mg/dL (ref 7–25)
CHLORIDE: 106 mmol/L (ref 98–110)
CO2: 24 mmol/L (ref 20–31)
Calcium: 9.6 mg/dL (ref 8.6–10.3)
Creat: 0.97 mg/dL (ref 0.60–1.35)
GLUCOSE: 79 mg/dL (ref 65–99)
Potassium: 4.7 mmol/L (ref 3.5–5.3)
SODIUM: 141 mmol/L (ref 135–146)
TOTAL PROTEIN: 7.5 g/dL (ref 6.1–8.1)
Total Bilirubin: 0.7 mg/dL (ref 0.2–1.2)

## 2015-04-10 LAB — CBC
HCT: 46.4 % (ref 39.0–52.0)
HEMOGLOBIN: 16.1 g/dL (ref 13.0–17.0)
MCH: 32.4 pg (ref 26.0–34.0)
MCHC: 34.7 g/dL (ref 30.0–36.0)
MCV: 93.4 fL (ref 78.0–100.0)
MPV: 10.2 fL (ref 8.6–12.4)
Platelets: 220 10*3/uL (ref 150–400)
RBC: 4.97 MIL/uL (ref 4.22–5.81)
RDW: 13.8 % (ref 11.5–15.5)
WBC: 5.9 10*3/uL (ref 4.0–10.5)

## 2015-04-10 LAB — RHEUMATOID FACTOR

## 2015-04-10 LAB — URIC ACID: Uric Acid, Serum: 6.2 mg/dL (ref 4.0–7.8)

## 2015-04-10 LAB — TSH: TSH: 2.097 u[IU]/mL (ref 0.350–4.500)

## 2015-04-10 MED ORDER — TRIAMCINOLONE ACETONIDE 0.1 % EX CREA: 1.0000 "application " | TOPICAL_CREAM | Freq: Two times a day (BID) | CUTANEOUS | Status: DC

## 2015-04-10 MED ORDER — ETODOLAC 500 MG PO TABS: 500.0000 mg | ORAL_TABLET | Freq: Two times a day (BID) | ORAL | Status: DC

## 2015-04-10 NOTE — Progress Notes (Signed)
Subjective Chief Complaint  Patient presents with  . Neck Pain    going on for a while on and off   . Numbness    in arms and hands  . injury to left thumb 6 months not any better   Here for neck pains for 4 years, but worse in last 6 months.  Gets pain in neck for several weeks at a time, then gets better for period of time.   Gets pain in tingling in chest arms and legs.  Last symptoms about 6 days ago.  At that time was wrestling with girlfriends kids, then pain started the next day in neck.  Starts gradual and gets worse as the hours go on.  Gets neck stiffness and upper back.  Gets so bad that wakes up in the middle of night, can't stand up due to pain.  In the past when this happens, will use rest and limited activity until things calm down.  Sometimes getting back to activity after a period of rest helps the pain.  Is the most painful sitting or lying.  walking and activity seems to help at times .  Gets pains in both arms daily for the last 62mo.  The pains are sharp pains in the arms, particularly the wrists and sometimes in chest.   Gets prickly feeling in arms.   Not specifically numb though.  Has also gotten similar symptoms in legs, but this is not consistent.  No other numbness, no facial or genital sensation changes or pain.     In the last month has had some lightheadedness, particularly if lying down, but no changes in vision, hearing, no confusion.   Gets a chest pain regularly, brief, weird feelings . Sometimes stretching helps.  No SOB.  No associated nausea, sweats.    Went to the emergency dept for chest and back pain May 2016 [facility] in [place], said his heart was fine, but they mentioned RA.  Denies hand swelling or fingers.  Does get joint stiffness often, feet, hands, neck, back.  Does get bilat wrist pain.  No hx/o rheumatoid arthritis in family.  Is stiff in the mornings and late in the evening at bedtime.    Past Medical History  Diagnosis Date  . Environmental  allergies   . Weakness 2003    acute fatigue, weakness - overnight hospitalization, but patient doesn't recall details  . Exercise-induced asthma 9/14    mild   Family History  Problem Relation Age of Onset  . Other Mother     vision problems  . Other Sister     vision problems  . Heart disease Paternal Uncle   . Heart disease Paternal Grandfather   . Cancer Neg Hx   . Stroke Neg Hx   . Hypertension Neg Hx   . Hyperlipidemia Neg Hx   . Diabetes Neg Hx    ROS as in subjective  Objective: BP 100/80 mmHg  Pulse 83  Resp 15  Wt 254 lb (115.214 kg)  General appearance: alert, no distress, WD/WN Skin: faint nonspecific rough skin rash of bilat anterolateral forearms, no other skin lesions Neck: supple, non tender, normal ROM, no lymphadenopathy, no thyromegaly, no masses Heart: RRR, normal S1, S2, no murmurs Lungs: CTA bilaterally, no wheezes, rhonchi, or rales Abdomen: +bs, soft, non tender, non distended, no masses, no hepatomegaly, no splenomegaly Back: non tender Musculoskeletal: tender left thumb at MCP mildly, pain with adduction of thumb, otherwise non tender, no other joint tenderness,  no joint swelling, no bony obvious changes, non tender, no swelling, no obvious deformity Back: nontender, relatively normal ROM Extremities: no edema, no cyanosis, no clubbing Pulses: 2+ symmetric, upper and lower extremities, normal cap refill Neurological: alert, oriented x 3, CN2-12 intact, strength normal upper extremities and lower extremities, sensation normal throughout, DTRs 2+ throughout, no cerebellar signs, gait normal Psychiatric: normal affect, behavior normal, pleasant     Assessment: Encounter Diagnoses  Name Primary?  . Neck pain Yes  . Paresthesia of upper extremity   . Leg paresthesia   . Polyarthralgia   . Joint stiffness   . Thumb pain, left   . Smoker   . Rash and nonspecific skin eruption      Plan: Discussed the variety of symptoms, some may or may not  be related.  Will request ED records from May, [address].    discussed symptoms and signs of RA. Discussed symptoms of bulging disc in C spine and other cervical spine radiculopathy.  Discussed other causes of joint pains, stiffness, neck pain, paresthesias of arm.   Labs today. Consider xray neck and hands.   Thumb pain - possible ligament sprain.  Advised OTC Thumb spica splint x 2 wk.  Can use Lodine BID.    Counseled on smoking cessation  F/u pending labs.    Joshua Cox was seen today for neck pain, numbness and injury to left thumb 6 months not any better.  Diagnoses and all orders for this visit:  Neck pain Orders: -     Comprehensive metabolic panel -     CBC -     TSH -     Cyclic citrul peptide antibody, IgG -     Sedimentation rate -     Uric acid -     Rheumatoid factor -     ANA  Paresthesia of upper extremity Orders: -     Comprehensive metabolic panel -     CBC -     TSH -     Cyclic citrul peptide antibody, IgG -     Sedimentation rate -     Uric acid -     Rheumatoid factor -     ANA  Leg paresthesia Orders: -     Comprehensive metabolic panel -     CBC -     TSH -     Cyclic citrul peptide antibody, IgG -     Sedimentation rate -     Uric acid -     Rheumatoid factor -     ANA  Polyarthralgia Orders: -     Comprehensive metabolic panel -     CBC -     TSH -     Cyclic citrul peptide antibody, IgG -     Sedimentation rate -     Uric acid -     Rheumatoid factor -     ANA  Joint stiffness Orders: -     Comprehensive metabolic panel -     CBC -     TSH -     Cyclic citrul peptide antibody, IgG -     Sedimentation rate -     Uric acid -     Rheumatoid factor -     ANA  Thumb pain, left Orders: -     Comprehensive metabolic panel -     CBC -     TSH -     Cyclic citrul peptide antibody, IgG -  Sedimentation rate -     Uric acid -     Rheumatoid factor -     ANA  Smoker Orders: -     Comprehensive metabolic  panel -     CBC -     TSH -     Cyclic citrul peptide antibody, IgG -     Sedimentation rate -     Uric acid -     Rheumatoid factor -     ANA  Rash and nonspecific skin eruption  Other orders -     etodolac (LODINE) 500 MG tablet; Take 1 tablet (500 mg total) by mouth 2 (two) times daily. -     triamcinolone cream (KENALOG) 0.1 %; Apply 1 application topically 2 (two) times daily.

## 2015-04-11 ENCOUNTER — Other Ambulatory Visit: Payer: Self-pay | Admitting: Medical

## 2015-04-11 DIAGNOSIS — M542 Cervicalgia: Secondary | ICD-10-CM

## 2015-04-11 DIAGNOSIS — M79642 Pain in left hand: Secondary | ICD-10-CM

## 2015-04-11 DIAGNOSIS — M79645 Pain in left finger(s): Secondary | ICD-10-CM

## 2015-04-11 DIAGNOSIS — M5410 Radiculopathy, site unspecified: Secondary | ICD-10-CM

## 2015-04-11 DIAGNOSIS — M79641 Pain in right hand: Secondary | ICD-10-CM

## 2015-04-11 DIAGNOSIS — M2560 Stiffness of unspecified joint, not elsewhere classified: Secondary | ICD-10-CM

## 2015-04-11 LAB — CYCLIC CITRUL PEPTIDE ANTIBODY, IGG: Cyclic Citrullin Peptide Ab: <2 U/mL (ref 0.0–5.0)

## 2015-04-11 LAB — ANA: Anti Nuclear Antibody(ANA): NEGATIVE

## 2015-04-11 LAB — SEDIMENTATION RATE: Sed Rate: 1 mm/hr (ref 0–15)

## 2015-04-17 ENCOUNTER — Ambulatory Visit
Admission: RE | Admit: 2015-04-17 | Discharge: 2015-04-17 | Disposition: A | Discharge status: Home / Self Care | Payer: Commercial Managed Care - HMO | Source: Ambulatory Visit | Attending: Medical | Admitting: Medical

## 2015-04-17 ENCOUNTER — Other Ambulatory Visit: Payer: Self-pay | Admitting: Medical

## 2015-04-17 ENCOUNTER — Encounter (INDEPENDENT_AMBULATORY_CARE_PROVIDER_SITE_OTHER): Payer: Self-pay

## 2015-04-17 DIAGNOSIS — M542 Cervicalgia: Secondary | ICD-10-CM

## 2015-04-17 DIAGNOSIS — M79642 Pain in left hand: Secondary | ICD-10-CM

## 2015-04-17 DIAGNOSIS — M79641 Pain in right hand: Secondary | ICD-10-CM

## 2015-04-17 DIAGNOSIS — M2560 Stiffness of unspecified joint, not elsewhere classified: Secondary | ICD-10-CM

## 2015-04-17 DIAGNOSIS — M5410 Radiculopathy, site unspecified: Secondary | ICD-10-CM

## 2015-04-17 DIAGNOSIS — M79645 Pain in left finger(s): Secondary | ICD-10-CM

## 2015-04-17 MED ORDER — CYCLOBENZAPRINE HCL 10 MG PO TABS: ORAL_TABLET | ORAL | Status: DC

## 2015-06-18 ENCOUNTER — Ambulatory Visit: Payer: Commercial Managed Care - HMO | Admitting: Medical

## 2015-06-18 ENCOUNTER — Encounter: Payer: Self-pay | Admitting: Medical

## 2015-06-18 ENCOUNTER — Ambulatory Visit (INDEPENDENT_AMBULATORY_CARE_PROVIDER_SITE_OTHER): Payer: Commercial Managed Care - HMO | Admitting: Medical

## 2015-06-18 ENCOUNTER — Telehealth: Payer: Self-pay | Admitting: Medical

## 2015-06-18 VITALS — BP 122/80 | HR 78 | Wt 256.0 lb

## 2015-06-18 DIAGNOSIS — M609 Myositis, unspecified: Secondary | ICD-10-CM

## 2015-06-18 DIAGNOSIS — M549 Dorsalgia, unspecified: Secondary | ICD-10-CM | POA: Insufficient documentation

## 2015-06-18 DIAGNOSIS — Z8249 Family history of ischemic heart disease and other diseases of the circulatory system: Secondary | ICD-10-CM | POA: Diagnosis not present

## 2015-06-18 DIAGNOSIS — M791 Myalgia: Secondary | ICD-10-CM

## 2015-06-18 DIAGNOSIS — R202 Paresthesia of skin: Secondary | ICD-10-CM

## 2015-06-18 DIAGNOSIS — G8929 Other chronic pain: Secondary | ICD-10-CM

## 2015-06-18 DIAGNOSIS — Z720 Tobacco use: Secondary | ICD-10-CM | POA: Diagnosis not present

## 2015-06-18 DIAGNOSIS — M542 Cervicalgia: Secondary | ICD-10-CM | POA: Diagnosis not present

## 2015-06-18 DIAGNOSIS — F17200 Nicotine dependence, unspecified, uncomplicated: Secondary | ICD-10-CM

## 2015-06-18 DIAGNOSIS — M79609 Pain in unspecified limb: Secondary | ICD-10-CM | POA: Diagnosis not present

## 2015-06-18 DIAGNOSIS — IMO0001 Reserved for inherently not codable concepts without codable children: Secondary | ICD-10-CM | POA: Insufficient documentation

## 2015-06-18 LAB — LIPID PANEL
Cholesterol: 209 mg/dL — ABNORMAL HIGH (ref 125–200)
HDL: 32 mg/dL — ABNORMAL LOW (ref >=40)
LDL CALC: 136 mg/dL — AB (ref <130)
Total CHOL/HDL Ratio: 6.5 Ratio — ABNORMAL HIGH (ref <=5.0)
Triglycerides: 207 mg/dL — ABNORMAL HIGH (ref <150)
VLDL: 41 mg/dL — AB (ref <30)

## 2015-06-18 LAB — CK: Total CK: 129 U/L (ref 7–232)

## 2015-06-18 MED ORDER — ETODOLAC 500 MG PO TABS: 500.0000 mg | ORAL_TABLET | Freq: Two times a day (BID) | ORAL | Status: DC

## 2015-06-18 MED ORDER — CYCLOBENZAPRINE HCL 10 MG PO TABS: ORAL_TABLET | ORAL | Status: DC

## 2015-06-18 MED ORDER — VARENICLINE TARTRATE 0.5 MG X 11 & 1 MG X 42 PO MISC: ORAL | Status: DC

## 2015-06-18 NOTE — Progress Notes (Signed)
Subjective Chief Complaint  Patient presents with  . Follow-up    about his back. every thing still feels the same. joined a gym to try and lose weight.    Here for recheck.  I saw him in August for a variety of symptoms including 4 year hx/o neck pain with worsening in the last 6 months.  At that time he reported that he would get pain in neck for several weeks at a time, then it would get better for period of time.   Gets pain and tingling in chest, arms and legs.  Symptoms can last about 6 days ago.  Pain starts gradual and gets worse as the hours go on.  Gets neck stiffness and upper and mid back stiffness.  Gets so bad that it wakes up in the middle of night, can't stand up due to pain sometimes.  In the past when this happens, will use rest and limited activity until things calm down.  Sometimes getting back to activity after a period of rest helps the pain.  Is the most painful sitting or lying.  walking and activity seems to help at times .  Gets pains in both arms daily for the last 48mo.  The pains are sharp pains in the arms, particularly the wrists and sometimes in chest.   Gets prickly feeling in arms.   Not specifically numb though.  Has also gotten similar symptoms in legs, but this is not consistent.  No other numbness, no facial or genital sensation changes or pain.     Went to the emergency dept for chest and back pain May 2016 [place] in [place], said his heart was fine, but they mentioned RA.  Denies hand swelling or fingers.  Does get joint stiffness often, feet, hands, neck, back.  Does get bilat wrist pain.  No hx/o rheumatoid arthritis in family.  Is stiff in the mornings and late in the evening at bedtime.   He does travel a lot of work, and sometimes sleeping in hotels can improve the pain a bit.  He notes his mattress is 34 years old.   He has tried a few different pillows since his arms can go to sleep in the night causing numbness.  He is a smoker.   He notes prior  lipid panel about a year ago had HDL a little low and LDL a little elevated.  He is using stretching and cardio exercising.  Will do cardio several days per week including 20 minutes of fast walk on the treadmill followed by 30 minutes elliptical, followed by 30 minutes again on treadmill.  Gets heart rate up to 160, and has no problem with endurance with this.    No concern for STD, but does see [facility] dermatology for hx/o warts.  Been with same partner the last several years.  No hx/o HIV or syphilis.   Sometimes feels fatigue or non-restful sleep, but most of the time denies fatigue, no daytime somnolence, no witnessed apnea.    Also wants to stop smoking with Chantix. Had quit prior with chantix although the weird dreams interfered with sleep.   Would rather do this than wellbutrin, and prior patches didn't seem to work  Family history No family hx/o rheumatoid disease.  He has some uncles with heart disease but onset was 25s.   No family hx/o OSA or cervical spine DDD.   Past Medical History  Diagnosis Date  . Environmental allergies   . Weakness 2003  acute fatigue, weakness - overnight hospitalization, but patient doesn't recall details  . Exercise-induced asthma 9/14    mild   Family History  Problem Relation Age of Onset  . Other Mother     vision problems  . Other Sister     vision problems  . Heart disease Paternal Uncle   . Heart disease Paternal Grandfather   . Cancer Neg Hx   . Stroke Neg Hx   . Hypertension Neg Hx   . Hyperlipidemia Neg Hx   . Diabetes Neg Hx    ROS as in subjective  Objective: BP 122/80 mmHg  Pulse 78  Wt 256 lb (116.121 kg)  General appearance: alert, no distress, WD/WN, overweight white male Skin: large tattoo of left upper arm, tattoo right upper arm Neck: supple, non tender, normal ROM, but he notes stiffness with ROM, no obvious spasm, no lymphadenopathy, no thyromegaly, no masses Heart: RRR, normal S1, S2, no murmurs Lungs: CTA  bilaterally, no wheezes, rhonchi, or rales Abdomen: +bs, soft, non tender, non distended, no masses, no hepatomegaly, no splenomegaly  Back: non tender, normal ROM Musculoskeletal: tender left thumb at MCP mildly, pain with adduction of thumb, otherwise non tender, no other joint tenderness, no joint swelling, no bony obvious changes, non tender, no swelling, no obvious deformity Extremities: no edema, no cyanosis, no clubbing Pulses: 2+ symmetric, upper and lower extremities, normal cap refill Neurological: alert, oriented x 3, CN2-12 intact, strength normal upper extremities and lower extremities, sensation normal throughout, DTRs 2+ throughout, no cerebellar signs, gait normal Psychiatric: normal affect, behavior normal, pleasant     Assessment: Encounter Diagnoses  Name Primary?  . Chronic neck pain Yes  . Mid back pain   . Smoker   . Paresthesia and pain of extremity   . Myalgia and myositis   . Family history of heart disease      Plan: Of note at last visit C spine xray was normal other than straightening of normal curvature, hand xrays normal, and labs were negative for rheumatoid screen.  Discussed differential - cervical DDD, cerival radiculopathy, mattress wore out, malalignment with sleeping position, myositis, seronegative rheumatoid condition, musculoskeletal neck and back pain, carpal/ulnar tunnel syndromes, other neuropathy, obesity related neck and back pain, other possible contributing factors include OSA, ischemia, infection such as HIV or other.   unfortunately etiology is not clear cut.  discussed possible next steps.  At this point we will check Lipid, CK.   Will refer to physical therapy.  Consider updating mattress, using softer/supportive pillos.  Consider EKG, sleep study, EMG/NCS.  Other next steps could include referral to ortho/rheumatology for consult.   Counseled on smoking cessation. Begin back on Chantix, discussed risks/benefits of medication.    F/u  pending labs.    Joshua Cox was seen today for follow-up.  Diagnoses and all orders for this visit:  Chronic neck pain -     CK -     Ambulatory referral to Physical Therapy  Mid back pain -     CK -     Ambulatory referral to Physical Therapy  Smoker -     Lipid panel -     Ambulatory referral to Physical Therapy  Paresthesia and pain of extremity -     CK -     Ambulatory referral to Physical Therapy  Myalgia and myositis -     CK -     Ambulatory referral to Physical Therapy  Family history of heart disease -  Lipid panel  Other orders -     varenicline (CHANTIX STARTING MONTH PAK) 0.5 MG X 11 & 1 MG X 42 tablet; Take one 0.5 mg tablet by mouth once daily for 3 days, then increase to one 0.5 mg tablet twice daily for 4 days, then increase to one 1 mg tablet twice daily. -     cyclobenzaprine (FLEXERIL) 10 MG tablet; 1/2-1 tablet po QHS prn -     etodolac (LODINE) 500 MG tablet; Take 1 tablet (500 mg total) by mouth 2 (two) times daily.

## 2015-06-18 NOTE — Telephone Encounter (Signed)
Refer to [facility]

## 2015-06-19 NOTE — Telephone Encounter (Signed)
[facility]

## 2015-08-21 ENCOUNTER — Telehealth: Payer: Self-pay | Admitting: Medical

## 2015-08-21 ENCOUNTER — Other Ambulatory Visit: Payer: Self-pay | Admitting: Medical

## 2015-08-21 MED ORDER — VARENICLINE TARTRATE 0.5 MG PO TABS: 0.5000 mg | ORAL_TABLET | Freq: Two times a day (BID) | ORAL | Status: DC

## 2015-08-21 NOTE — Telephone Encounter (Signed)
Pt called and was requesting a refill on his chantix pt can be reached at [phone] and pt uses [facility] - [place], [place] - [address]

## 2015-10-02 ENCOUNTER — Encounter: Payer: Self-pay | Admitting: Medical

## 2015-10-02 ENCOUNTER — Telehealth: Payer: Self-pay | Admitting: Medical

## 2015-10-02 ENCOUNTER — Ambulatory Visit (INDEPENDENT_AMBULATORY_CARE_PROVIDER_SITE_OTHER): Payer: Managed Care, Other (non HMO) | Admitting: Medical

## 2015-10-02 VITALS — BP 120/78 | HR 72 | Wt 257.0 lb

## 2015-10-02 DIAGNOSIS — M79645 Pain in left finger(s): Secondary | ICD-10-CM

## 2015-10-02 DIAGNOSIS — M609 Myositis, unspecified: Secondary | ICD-10-CM

## 2015-10-02 DIAGNOSIS — Z23 Encounter for immunization: Secondary | ICD-10-CM | POA: Insufficient documentation

## 2015-10-02 DIAGNOSIS — M79609 Pain in unspecified limb: Secondary | ICD-10-CM

## 2015-10-02 DIAGNOSIS — Z87891 Personal history of nicotine dependence: Secondary | ICD-10-CM | POA: Insufficient documentation

## 2015-10-02 DIAGNOSIS — M542 Cervicalgia: Secondary | ICD-10-CM

## 2015-10-02 DIAGNOSIS — IMO0001 Reserved for inherently not codable concepts without codable children: Secondary | ICD-10-CM

## 2015-10-02 DIAGNOSIS — M791 Myalgia: Secondary | ICD-10-CM | POA: Diagnosis not present

## 2015-10-02 DIAGNOSIS — R202 Paresthesia of skin: Secondary | ICD-10-CM

## 2015-10-02 DIAGNOSIS — G8929 Other chronic pain: Secondary | ICD-10-CM

## 2015-10-02 DIAGNOSIS — M549 Dorsalgia, unspecified: Secondary | ICD-10-CM

## 2015-10-02 NOTE — Telephone Encounter (Signed)
Faxed to [facility] once office note is complete

## 2015-10-02 NOTE — Telephone Encounter (Signed)
Please give him name of massage therapist I use here in [place].  Massage Therapy:  Sharen Hint [facility] [address] [place] [address] [place], [place] [address] [phone] [email or web address]

## 2015-10-02 NOTE — Progress Notes (Signed)
Subjective: Chief Complaint  Patient presents with  . quit smoking    last time smoked was thanksgiving. will give flu shot at end of visit. states his thumb is causing more problems for him even though it has almost been a year since injury.    Smoking cessation - Hasn't smoked since Thanksgiving.  After day 7of starting Chantix, tried to quit, tried again day 10.  Day after thanksgiving was able to quit.  Lately has been doing well without tobacco.   Been watching a guy on You tube who quit after 18 years to get motivation on quitting tobacco.   Has 16 year hx/o smoking.   Quit chantix few weeks ago.   Been working a lot more hours since December, stress was high, and decided to quit chantix for a while due to sleep concerns.   Been off Chantix since beginning of January.  chantix did give him dreams that made him tossing and turning in bed, almost like extended daydream.    Since last visit 06/2015 regarding polyarthralgia, did physical therapy for about a month since last visit.  Has been working on posture, home stretching and exercise program.  Had dry needling as well, helped with muscle soreness, but wasn't quite painful to the skin.  Had this 4 times.  Dry needling seems to work quite a bit better than the muscle relaxer.  No prior massage or chiropractor therapy.  Still having the neck and back pains, but not the severe pains in a while.  Is a battle every week.  Tingling in arms, hands, and chest gone since he quit tobacco.  No other aggravating or relieving factors.   Here for f/u on left thumb pains x 1 year.   thumb almost unusable.   couldn't pick up glass of water 9mo ago.  still having ongoing problems with left thumb for over a year now despite prior splint for months this year and RICE and NSAIDs    Past Medical History  Diagnosis Date  . Environmental allergies   . Weakness 2003    acute fatigue, weakness - overnight hospitalization, but patient doesn't recall details  .  Exercise-induced asthma 9/14    mild   ROS as in subjective  Objective: BP 120/78 mmHg  Pulse 72  Wt 257 lb (116.574 kg)  Gen: wd, wn, nad Skin unremarkable Left thumb seems slightly reduced strength with flexion of DIP, mild tenderness over MCP otherwise nontender, no deformity, normal ROM, no obvious laxity.  Rest of hands and fingers unremarkable Hand neurovascularly intact    Assessment: Encounter Diagnoses  Name Primary?  . Chronic pain of left thumb Yes  . Former smoker   . Need for prophylactic vaccination and inoculation against influenza   . Chronic neck pain   . Mid back pain   . Paresthesia and pain of extremity   . Myalgia and myositis      Plan: Chronic thumb pain - refer to ortho  Former smoker - congratulated him on cessation and counseled on staying abstinent  Counseled on the influenza virus vaccine.  Vaccine information sheet given.  Influenza vaccine given after consent obtained.  Neck and back pain, polyarthralgia's - improved since quitting tobacco.  Advised he begin exercise program to include stretching, lap swimming at the [facility] in [place] .  Consider massage prn.

## 2015-10-02 NOTE — Telephone Encounter (Signed)
Refer to [facility] for chronic left thumb pain x 1 year

## 2015-10-03 NOTE — Telephone Encounter (Signed)
Pt is aware and stated he wrote it all down

## 2016-02-24 ENCOUNTER — Encounter: Payer: Self-pay | Admitting: Medical

## 2016-02-24 ENCOUNTER — Ambulatory Visit (INDEPENDENT_AMBULATORY_CARE_PROVIDER_SITE_OTHER): Payer: Managed Care, Other (non HMO) | Admitting: Medical

## 2016-02-24 VITALS — BP 108/70 | HR 86 | Wt 246.0 lb

## 2016-02-24 DIAGNOSIS — M79644 Pain in right finger(s): Secondary | ICD-10-CM

## 2016-02-24 DIAGNOSIS — S61239A Puncture wound without foreign body of unspecified finger without damage to nail, initial encounter: Secondary | ICD-10-CM

## 2016-02-24 DIAGNOSIS — L0390 Cellulitis, unspecified: Secondary | ICD-10-CM

## 2016-02-24 NOTE — Progress Notes (Signed)
Subjective: Chief Complaint  Patient presents with  . metal in thumb.    wants tetanus shot. lt thumb. states it is fine. is on bactrim antibiotic right now   Date of injury 02/23/16.    Was repairing a bicycle, putting on the pedal, but threading stripped.   Ended up getting a shard of metal in left thumb.  Pulled the shard out.  Doesn't think he has any remaining metal in the thumb.  But he metal was rusty.   Cleaned the thumb.  Not sure if tetanus is up to date.  Is on antibiotics currently from skin infection.  Had went to urgent care for bug bite, was put on antibiotic bactrim for this and inflamed lymph nodes.   Went to urgent care last week for skin infection of right neck , wants this looked at  He notes several week hx/o pain in right 5th finger, no injury or trauma.  No paresthesia.   Past Medical History  Diagnosis Date  . Environmental allergies   . Weakness 2003    acute fatigue, weakness - overnight hospitalization, but patient doesn't recall details  . Exercise-induced asthma 9/14    mild   ROS as in subjective   Objective: BP 108/70 mmHg  Pulse 86  Wt 246 lb (111.585 kg)  Gen: wd, wn, nad Skin: left thumb distal tip with small point of redness but no foreign body, no palpable knot ,no erythema otherwise, no warmth or fluctuance.  Right posterolateral neck with 1cm raised red lesion slightly warm, improved compared to patients recollection of prior findings. Right 5th finger tender over DIP particularly with lateral movement, otherwise no swelling or tenderness Hands/fingers otherwise neurovascularly intact   Assessment: Encounter Diagnoses  Name Primary?  . Puncture wound of finger, initial encounter Yes  . Finger pain, right   . Cellulitis of skin      Plan: Puncture wound - no obvious foreign body, no signs of infection, he is UTD on Td vaccine.   Advised good hygiene, and if any worsening of symptoms to recheck  Finger pain right 5th finger - advised  relative rest, OTC split buddy taping to 4th finger, NSAID OTC, avoid re injury.   If not improving in 1-2 weeks to call back  cellulitis - improving, finish out antibiotic. F/u prn.

## 2016-02-25 ENCOUNTER — Encounter: Payer: Self-pay | Admitting: Medical

## 2018-04-29 ENCOUNTER — Ambulatory Visit (INDEPENDENT_AMBULATORY_CARE_PROVIDER_SITE_OTHER): Payer: 59 | Admitting: Medical

## 2018-04-29 VITALS — BP 120/74 | HR 80 | Temp 98.0°F | Resp 16 | Ht 70.0 in | Wt 249.0 lb

## 2018-04-29 DIAGNOSIS — R0683 Snoring: Secondary | ICD-10-CM | POA: Diagnosis not present

## 2018-04-29 DIAGNOSIS — Z23 Encounter for immunization: Secondary | ICD-10-CM | POA: Diagnosis not present

## 2018-04-29 DIAGNOSIS — M542 Cervicalgia: Secondary | ICD-10-CM

## 2018-04-29 DIAGNOSIS — G4489 Other headache syndrome: Secondary | ICD-10-CM

## 2018-04-29 DIAGNOSIS — I889 Nonspecific lymphadenitis, unspecified: Secondary | ICD-10-CM

## 2018-04-29 NOTE — Addendum Note (Signed)
Addended by: Debbrah AlarSMITH, Deetta Siegmann F on: 04/29/2018 11:28 AM   Modules accepted: Orders

## 2018-04-29 NOTE — Progress Notes (Signed)
Subjective: Chief Complaint  Patient presents with  . headache    lymph nodes swollen, pain,X few months  headaches X 3 days, cough    Here for not feeling.   He notes some lymph swollen x a few months.     Has some bad headaches for last few days.   Worse headaches than prior.  Gets some sharp pains with these headaches.  headaches last 20-30 minutes but last several times daily.  Headaches are more in back.  No facial pains.   Very sharp in back of head.  Has had some right nostril producing a lot of snot of a few weeks.   Clear nasal drainage.  Has had some cough, but not feeling sick or like having respiratory illness.  No vision or hearing changes, no numbness or tingling .  No nausea or vomiting.    Has had intermittent neck and back issues, has seen me in the past for this.   Hadn't had problems in months but lately having some of these pains as well.  Still uses exercises that PT gave him back in 2017.  No fever, no night sweats.    Caffeine - drinks 2 "monster coffee" daily, but this is less caffeine than energy drinks.  Used to drink mountain dew.  No soda, no other caffeine.     Recently started smoking some clove and basic tobacco cigarette few times weekly.   Quit regular cigarettes about 2 years ago.  Drinks occasional alcohol.  sister has hx/o sleep apnea   Past Medical History:  Diagnosis Date  . Environmental allergies   . Exercise-induced asthma 9/14   mild  . Weakness 2003   acute fatigue, weakness - overnight hospitalization, but patient doesn't recall details   No current outpatient medications on file prior to visit.   No current facility-administered medications on file prior to visit.    Family History  Problem Relation Age of Onset  . Other Mother        vision problems  . Other Sister        vision problems  . Heart disease Paternal Uncle   . Heart disease Paternal Grandfather   . Cancer Neg Hx   . Stroke Neg Hx   . Hypertension Neg Hx   .  Hyperlipidemia Neg Hx   . Diabetes Neg Hx     ROS as in subjective   Objective: BP 120/74   Pulse 80   Temp 98 F (36.7 C) (Oral)   Resp 16   Ht 5\' 10"  (1.778 m)   Wt 249 lb (112.9 kg)   SpO2 95%   BMI 35.73 kg/m   Wt Readings from Last 3 Encounters:  04/29/18 249 lb (112.9 kg)  02/24/16 246 lb (111.6 kg)  10/02/15 257 lb (116.6 kg)   General appearance: alert, no distress, WD/WN, white male HEENT: normocephalic, sclerae anicteric, PERRLA, EOMi, nares patent, no discharge or erythema, pharynx normal Oral cavity: MMM, no lesions Neck: supple, mild shoddy slightly tender anterior nodes palpated, no thyromegaly, no masses, normal ROM Heart: RRR, normal S1, S2, no murmurs Lungs: CTA bilaterally, no wheezes, rhonchi, or rales Abdomen: +bs, soft, non tender, non distended, no masses, no hepatomegaly, no splenomegaly Back: non tender Musculoskeletal: nontender, no swelling, no obvious deformity Extremities: no edema, no cyanosis, no clubbing Pulses: 2+ symmetric, upper and lower extremities, normal cap refill Neurological: alert, oriented x 3, CN2-12 intact, strength normal upper extremities and lower extremities, sensation normal throughout, DTRs 2+  throughout, no cerebellar signs, gait normal Psychiatric: normal affect, behavior normal, pleasant     Assessment: Encounter Diagnoses  Name Primary?  . Headache syndrome Yes  . Neck pain   . Lymphadenitis   . Snoring   . Need for influenza vaccination      Plan: We discussed his symptoms. Exam mostly unremarkable.  Discussed lymphadenitis which is probably short-lived related to congestion.  Labs today just to  give him reassurance.  We discussed the role of lymph nodes and the fact that can be inflamed from time to time but no obvious exam abnormality today of concern  We discussed the possibility of sleep apnea as he does have some symptoms, sister has sleep apnea.  If the headaches continue or if there is more concern  we can send him for sleep study.  He will let me know in the next few weeks if he wants to pursue this  Neck pain- advised ibuprofen over-the-counter up to 3 tablets 2-3 times daily for the next 5-7 days.  Advised daily stretching and range of motion exercises with the neck and upper back and arms.  Follow-up if not improving  Headache syndrome-possibly tension headaches.  Advised stretching and range of motion activity throughout the day.  Get up in the computer at work to move around some.  limit caffeine.  Begin ibuprofen for the next 5 to 7 days for headache and neck pain.  If symptoms do not come down or persistent call back  Counseled on the influenza virus vaccine.  Vaccine information sheet given.   High dose Influenza vaccine given after consent obtained.   Joshua Cox was seen today for headache.  Diagnoses and all orders for this visit:  Headache syndrome -     CBC with Differential/Platelet -     TSH -     Comprehensive metabolic panel  Neck pain -     CBC with Differential/Platelet -     TSH -     Comprehensive metabolic panel  Lymphadenitis -     CBC with Differential/Platelet -     TSH -     Comprehensive metabolic panel  Snoring -     CBC with Differential/Platelet -     TSH -     Comprehensive metabolic panel  Need for influenza vaccination

## 2018-04-30 LAB — CBC WITH DIFFERENTIAL/PLATELET
Basophils Absolute: 0.1 10*3/uL (ref 0.0–0.2)
Basos: 1 %
EOS (ABSOLUTE): 0.3 10*3/uL (ref 0.0–0.4)
Eos: 4 %
HEMOGLOBIN: 17 g/dL (ref 13.0–17.7)
Hematocrit: 49.1 % (ref 37.5–51.0)
Immature Grans (Abs): 0 10*3/uL (ref 0.0–0.1)
Immature Granulocytes: 0 %
Lymphocytes Absolute: 3.1 10*3/uL (ref 0.7–3.1)
Lymphs: 40 %
MCH: 32.8 pg (ref 26.6–33.0)
MCHC: 34.6 g/dL (ref 31.5–35.7)
MCV: 95 fL (ref 79–97)
MONOCYTES: 11 %
Monocytes Absolute: 0.9 10*3/uL (ref 0.1–0.9)
Neutrophils Absolute: 3.4 10*3/uL (ref 1.4–7.0)
Neutrophils: 44 %
Platelets: 219 10*3/uL (ref 150–450)
RBC: 5.18 x10E6/uL (ref 4.14–5.80)
RDW: 13.5 % (ref 12.3–15.4)
WBC: 7.8 10*3/uL (ref 3.4–10.8)

## 2018-04-30 LAB — COMPREHENSIVE METABOLIC PANEL
ALT: 56 IU/L — AB (ref 0–44)
AST: 32 IU/L (ref 0–40)
Albumin/Globulin Ratio: 2 (ref 1.2–2.2)
Albumin: 4.7 g/dL (ref 3.5–5.5)
Alkaline Phosphatase: 96 IU/L (ref 39–117)
BILIRUBIN TOTAL: 0.5 mg/dL (ref 0.0–1.2)
BUN/Creatinine Ratio: 15 (ref 9–20)
BUN: 17 mg/dL (ref 6–20)
CHLORIDE: 105 mmol/L (ref 96–106)
CO2: 23 mmol/L (ref 20–29)
Calcium: 10 mg/dL (ref 8.7–10.2)
Creatinine, Ser: 1.12 mg/dL (ref 0.76–1.27)
GFR calc non Af Amer: 83 mL/min/{1.73_m2} (ref >59)
GFR, EST AFRICAN AMERICAN: 96 mL/min/{1.73_m2} (ref >59)
Globulin, Total: 2.4 g/dL (ref 1.5–4.5)
Glucose: 85 mg/dL (ref 65–99)
Potassium: 4.6 mmol/L (ref 3.5–5.2)
Sodium: 144 mmol/L (ref 134–144)
TOTAL PROTEIN: 7.1 g/dL (ref 6.0–8.5)

## 2018-04-30 LAB — TSH: TSH: 3.84 u[IU]/mL (ref 0.450–4.500)

## 2019-02-27 DIAGNOSIS — Z1159 Encounter for screening for other viral diseases: Secondary | ICD-10-CM | POA: Diagnosis not present

## 2019-03-20 ENCOUNTER — Ambulatory Visit (INDEPENDENT_AMBULATORY_CARE_PROVIDER_SITE_OTHER): Payer: 59 | Admitting: Osteopathic Medicine

## 2019-03-20 ENCOUNTER — Other Ambulatory Visit: Payer: Self-pay

## 2019-03-20 ENCOUNTER — Encounter: Payer: Self-pay | Admitting: Osteopathic Medicine

## 2019-03-20 VITALS — BP 133/90 | HR 84 | Temp 98.1°F | Ht 70.0 in | Wt 275.4 lb

## 2019-03-20 DIAGNOSIS — R5382 Chronic fatigue, unspecified: Secondary | ICD-10-CM | POA: Diagnosis not present

## 2019-03-20 DIAGNOSIS — R590 Localized enlarged lymph nodes: Secondary | ICD-10-CM

## 2019-03-20 DIAGNOSIS — Z0000 Encounter for general adult medical examination without abnormal findings: Secondary | ICD-10-CM | POA: Diagnosis not present

## 2019-03-20 DIAGNOSIS — E559 Vitamin D deficiency, unspecified: Secondary | ICD-10-CM

## 2019-03-20 NOTE — Progress Notes (Signed)
Done

## 2019-03-20 NOTE — Patient Instructions (Addendum)
General Preventive Care  Most recent routine screening lipids/other labs: ordered today.   Everyone should have blood pressure checked once per year. 130/80 or less is the goal!   Tobacco/e-cigarettes/vape: don't! Please let me know if you need help quitting!  Alcohol: responsible moderation is ok for most adults - if you have concerns about your alcohol intake, please talk to me!   Exercise: as tolerated to reduce risk of cardiovascular disease and diabetes. Strength training will also prevent osteoporosis.   Mental health: if need for mental health care (medicines, counseling, other), or concerns about moods, please let me know!   Sexual health: if need for STD testing, or if concerns with libido/pain problems, please let me know!   Advanced Directive: Living Will and/or Healthcare Power of Attorney recommended for all adults, regardless of age or health.  Vaccines  Flu vaccine: recommended for almost everyone, every fall.   Shingles vaccine: Shingrix recommended after age 2.   Pneumonia vaccines: Prevnar and Pneumovax recommended after age 61, or sooner if certain medical conditions.  Tetanus booster: Tdap recommended every 10 years. Due 2024.  Cancer screenings   Colon cancer screening: recommended for everyone at age 76, but some folks need a colonoscopy sooner if risk factors   Prostate cancer screening: PSA blood test around age 65  Lung cancer screening: CT chest every year for those sge 67 to 38 years old with ?30 pack year smoking history, who either currently smoke or have quit within the past 15 years. Infection screenings . HIV, Gonorrhea/Chlamydia: screening as needed. . Hepatitis C: recommended for anyone born 80-1965 . TB: certain at-risk populations, or depending on work requirements and/or travel history Other . Bone Density Test: recommended for men at age 1, sooner depending on risk factors . Abdominal Aortic Aneurysm: screening with ultrasound  recommended once for men age 56-75 who have ever smoked     Lymph nodes, allergies/sinus: I think what you're describing is normal lymph node response to infection/inflammation. In your case, allergies or vaping/e-cigarettes are probably contributing to this. See below for ways to treat allergy/sinus symptoms. Would try quitting vaping if you can. If you notice significant enlargement or pain in the lymph nodes, let me know and we can probably get you set up with some imaging / repeat blood tests.   Allergy/sinus congestion treatment:  . steroid nasal spray such as Flonase or Nasonex or one of their generic equivalents, PLUS... . an antihistamine such as Allegra, Zyrtec, or Claritin or one of their generic equivalents.  . Can also add a decongestant, either Sudafed on its own OR any of the antihistamines with the "-D" in the name that he has to show your ID to the pharmacist to buy.  . I recommend avoiding Afrin nasal spray unless absolutely needed, and then using it only for 2 days to prevent rebound congestion when the medicine is stopped.  If the combination isn't helping after about 2 weeks, let me know and I can send in a prescription antihistamine nasal spray to use with the oral medications as well. The above medicines will work for most people, though.      For sleep:  Let me know if this is something you'd like to talk about more! Can use benadryl at bedtime as needed, or try Melatonin supplementation around dinner-time.

## 2019-03-20 NOTE — Progress Notes (Addendum)
HPI: Joshua Cox is a 38 y.o. male who  has a past medical history of Environmental allergies, Exercise-induced asthma (9/14), and Weakness (2003).  he [facility] today, 03/20/19,  for chief complaint of: New to establish care  Requests physical/check-up Concern for enlarged lymph nodes neck     Patient here for annual physical / wellness exam.  See preventive care reviewed as below.    Additional concerns today include:   Cervical/submandibular lymph nodes intermittently become more enlarged/painful, not bothering him at this time. No night sweats or weight loss. (+)allergies/sinus congestion, chronic, takes no meds. (+)former smoker 1 pack every 1.5 days or so for 15 years. 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       Past medical, surgical, social and family history reviewed:  Patient Active Problem List   Diagnosis Date Noted  . Headache syndrome 04/29/2018  . Lymphadenitis 04/29/2018  . Snoring 04/29/2018  . Former smoker 10/02/2015  . Chronic pain of left thumb 10/02/2015  . Need for influenza vaccination 10/02/2015  . Neck pain 06/18/2015  . Mid back pain 06/18/2015  . Paresthesia and pain of extremity 06/18/2015  . Myalgia and myositis 06/18/2015  . Family history of heart disease 06/18/2015    History reviewed. No pertinent surgical history.  Social History   Tobacco Use  . Smoking status: Current Some Day Smoker    Packs/day: 0.75    Years: 14.00    Pack years: 10.50    Types: Cigars    Last attempt to quit: 08/02/2015    Years since quitting: 3.6  . Smokeless tobacco: Never Used  Substance Use Topics  . Alcohol use: Not Currently    Alcohol/week: 4.0 standard drinks    Types: 4 Standard drinks or equivalent per week    Family History   Problem Relation Age of Onset  . Other Mother        vision problems  . Other Sister        vision problems  . Heart disease Paternal Uncle   . Heart disease Paternal Grandfather   . Cancer Neg Hx   . Stroke Neg Hx   . Hypertension Neg Hx   . Hyperlipidemia Neg Hx   . Diabetes Neg Hx      Current medication list and allergy/intolerance information reviewed:    No current outpatient medications on file.   No current facility-administered medications for this visit.     No Known Allergies    Review of Systems:  Constitutional:  No  fever, no chills, No recent illness, No unintentional weight changes. No significant fatigue.   HEENT: No  headache, no vision change, no hearing change, No sore throat, No  sinus pressure  Cardiac: No  chest pain, No  pressure, No palpitations, No  Orthopnea  Respiratory:  No  shortness of breath. No  Cough  Gastrointestinal: No  abdominal pain, No  nausea, No  vomiting,  No  blood in stool, No  diarrhea, No  constipation   Musculoskeletal: No new myalgia/arthralgia  Skin: No  Rash, No other wounds/concerning lesions  Genitourinary: No  incontinence, No  abnormal genital bleeding, No abnormal genital discharge  Hem/Onc: No  easy bruising/bleeding, +abnormal lymph node  Endocrine: No cold intolerance,  No heat intolerance. No polyuria/polydipsia/polyphagia   Neurologic: No  weakness, No  dizziness, No  slurred speech/focal weakness/facial droop  Psychiatric: No  concerns with depression, No  concerns with anxiety, No sleep problems, No mood problems  Exam:  BP 133/90 (BP Location: Left Arm, Patient Position: Sitting, Cuff Size: Large)   Pulse 84   Temp 98.1 F (36.7 C) (Oral)   Ht 5\' 10"  (1.778 m)   Wt 275 lb 6.4 oz (124.9 kg)   BMI 39.52 kg/m   Constitutional: VS see above. General Appearance: alert, well-developed, well-nourished, NAD  Eyes: Normal lids and conjunctive, non-icteric sclera  Ears, Nose, Mouth, Throat: MMM,  Normal external inspection ears/nares/mouth/lips/gums. TM normal bilaterally. Pharynx/tonsils no erythema, no exudate. Nasal mucosa normal.   Neck: No masses, trachea midline. No thyroid enlargement. No tenderness/mass appreciated. No lymphadenopathy  Respiratory: Normal respiratory effort. no wheeze, no rhonchi, no rales  Cardiovascular: S1/S2 normal, no murmur, no rub/gallop auscultated. RRR. No lower extremity edema.   Gastrointestinal: Nontender, no masses. No hepatomegaly, no splenomegaly. No hernia appreciated. Bowel sounds normal. Rectal exam deferred.   Musculoskeletal: Gait normal. No clubbing/cyanosis of digits.   Neurological: Normal balance/coordination. No tremor. No cranial nerve deficit on limited exam. Motor and sensation intact and symmetric. Cerebellar reflexes intact.   Skin: warm, dry, intact. No rash/ulcer. No concerning nevi or subq nodules on limited exam.    Psychiatric: Normal judgment/insight. Normal mood and affect. Oriented x3.        ASSESSMENT/PLAN: The primary encounter diagnosis was Annual physical exam. Diagnoses of Chronic fatigue, Cervical lymphadenopathy, and Vitamin D deficiency were also pertinent to this visit.  Orders Placed This Encounter  Procedures  . CBC  . COMPLETE METABOLIC PANEL WITH GFR  . Lipid panel  . TSH  . VITAMIN D 25 Hydroxy (Vit-D Deficiency, Fractures)  . CBC with Differential/Platelet    No orders of the defined types were placed in this encounter.   Patient Instructions  General Preventive Care  Most recent routine screening lipids/other labs: ordered today.   Everyone should have blood pressure checked once per year. 130/80 or less is the goal!   Tobacco/e-cigarettes/vape: don't! Please let me know if you need help quitting!  Alcohol: responsible moderation is ok for most adults - if you have concerns about your alcohol intake, please talk to me!   Exercise: as tolerated to reduce risk of cardiovascular  disease and diabetes. Strength training will also prevent osteoporosis.   Mental health: if need for mental health care (medicines, counseling, other), or concerns about moods, please let me know!   Sexual health: if need for STD testing, or if concerns with libido/pain problems, please let me know!   Advanced Directive: Living Will and/or Healthcare Power of Attorney recommended for all adults, regardless of age or health.  Vaccines  Flu vaccine: recommended for almost everyone, every fall.   Shingles vaccine: Shingrix recommended after age 350.   Pneumonia vaccines: Prevnar and Pneumovax recommended after age 38, or sooner if certain medical conditions.  Tetanus booster: Tdap recommended every 10 years. Due 2024.  Cancer screenings   Colon cancer screening: recommended for everyone at age 38, but some folks need a colonoscopy sooner if risk factors   Prostate cancer screening: PSA blood test around age 38  Lung cancer screening: CT chest every year for those sge 6255 to 38 years old with ?30 pack year smoking history, who either currently smoke or have quit within the past 15 years. Infection screenings . HIV, Gonorrhea/Chlamydia: screening as needed. . Hepatitis C: recommended for anyone born 211945-1965 .  TB: certain at-risk populations, or depending on work requirements and/or travel history Other . Bone Density Test: recommended for men at age 63, sooner depending on risk factors . Abdominal Aortic Aneurysm: screening with ultrasound recommended once for men age 76-75 who have ever smoked     Lymph nodes, allergies/sinus: I think what you're describing is normal lymph node response to infection/inflammation. In your case, allergies or vaping/e-cigarettes are probably contributing to this. See below for ways to treat allergy/sinus symptoms. Would try quitting vaping if you can. If you notice significant enlargement or pain in the lymph nodes, let me know and we can probably get you  set up with some imaging / repeat blood tests.   Allergy/sinus congestion treatment:  . steroid nasal spray such as Flonase or Nasonex or one of their generic equivalents, PLUS... . an antihistamine such as Allegra, Zyrtec, or Claritin or one of their generic equivalents.  . Can also add a decongestant, either Sudafed on its own OR any of the antihistamines with the "-D" in the name that he has to show your ID to the pharmacist to buy.  . I recommend avoiding Afrin nasal spray unless absolutely needed, and then using it only for 2 days to prevent rebound congestion when the medicine is stopped.  If the combination isn't helping after about 2 weeks, let me know and I can send in a prescription antihistamine nasal spray to use with the oral medications as well. The above medicines will work for most people, though.      For sleep:  Let me know if this is something you'd like to talk about more! Can use benadryl at bedtime as needed, or try Melatonin supplementation around dinner-time.          Visit summary with medication list and pertinent instructions was printed for patient to review. All questions at time of visit were answered - patient instructed to contact office with any additional concerns or updates. ER/RTC precautions were reviewed with the patient.    Please note: voice recognition software was used to produce this document, and typos may escape review. Please contact Dr. Sheppard Coil for any needed clarifications.     Follow-up plan: Return in about 1 year (around 03/19/2020) for [place] (call week prior to visit for lab orders). See me sooner if needed! Marland Kitchen

## 2019-03-21 LAB — COMPLETE METABOLIC PANEL WITH GFR
AG Ratio: 1.6 (calc) (ref 1.0–2.5)
ALT: 67 U/L — ABNORMAL HIGH (ref 9–46)
AST: 36 U/L (ref 10–40)
Albumin: 4.4 g/dL (ref 3.6–5.1)
Alkaline phosphatase (APISO): 109 U/L (ref 36–130)
BUN: 16 mg/dL (ref 7–25)
CO2: 30 mmol/L (ref 20–32)
Calcium: 9.9 mg/dL (ref 8.6–10.3)
Chloride: 106 mmol/L (ref 98–110)
Creat: 1.02 mg/dL (ref 0.60–1.35)
GFR, Est African American: 108 mL/min/{1.73_m2} (ref >=60)
GFR, Est Non African American: 93 mL/min/{1.73_m2} (ref >=60)
Globulin: 2.7 g/dL (calc) (ref 1.9–3.7)
Glucose, Bld: 95 mg/dL (ref 65–99)
Potassium: 4.1 mmol/L (ref 3.5–5.3)
Sodium: 141 mmol/L (ref 135–146)
Total Bilirubin: 0.6 mg/dL (ref 0.2–1.2)
Total Protein: 7.1 g/dL (ref 6.1–8.1)

## 2019-03-21 LAB — CBC WITH DIFFERENTIAL/PLATELET
Absolute Monocytes: 673 cells/uL (ref 200–950)
Basophils Absolute: 61 cells/uL (ref 0–200)
Basophils Relative: 1.2 %
Eosinophils Absolute: 209 cells/uL (ref 15–500)
Eosinophils Relative: 4.1 %
HCT: 45.5 % (ref 38.5–50.0)
Hemoglobin: 15.7 g/dL (ref 13.2–17.1)
Lymphs Abs: 2096 cells/uL (ref 850–3900)
MCH: 31.8 pg (ref 27.0–33.0)
MCHC: 34.5 g/dL (ref 32.0–36.0)
MCV: 92.3 fL (ref 80.0–100.0)
MPV: 10 fL (ref 7.5–12.5)
Monocytes Relative: 13.2 %
Neutro Abs: 2060 cells/uL (ref 1500–7800)
Neutrophils Relative %: 40.4 %
Platelets: 243 10*3/uL (ref 140–400)
RBC: 4.93 10*6/uL (ref 4.20–5.80)
RDW: 12.5 % (ref 11.0–15.0)
Total Lymphocyte: 41.1 %
WBC: 5.1 10*3/uL (ref 3.8–10.8)

## 2019-03-21 LAB — LIPID PANEL
Cholesterol: 198 mg/dL (ref <200)
HDL: 33 mg/dL — ABNORMAL LOW (ref >=40)
LDL Cholesterol (Calc): 120 mg/dL (calc) — ABNORMAL HIGH
Non-HDL Cholesterol (Calc): 165 mg/dL (calc) — ABNORMAL HIGH (ref <130)
Total CHOL/HDL Ratio: 6 (calc) — ABNORMAL HIGH (ref <5.0)
Triglycerides: 317 mg/dL — ABNORMAL HIGH (ref <150)

## 2019-03-21 LAB — TSH: TSH: 2.98 mIU/L (ref 0.40–4.50)

## 2019-03-21 LAB — VITAMIN D 25 HYDROXY (VIT D DEFICIENCY, FRACTURES): Vit D, 25-Hydroxy: 13 ng/mL — ABNORMAL LOW (ref 30–100)

## 2019-03-23 MED ORDER — VITAMIN D (ERGOCALCIFEROL) 1.25 MG (50000 UNIT) PO CAPS: 50000.0000 [IU] | ORAL_CAPSULE | ORAL | 0 refills | Status: AC

## 2019-03-23 NOTE — Addendum Note (Signed)
Addended by: Maryla Morrow on: 03/23/2019 05:12 PM   Modules accepted: Orders
